# Patient Record
Sex: Male | Born: 1945 | Race: White | Hispanic: No | Marital: Married | Smoking: Former smoker
Health system: Southern US, Community
[De-identification: ages and names within clinical notes are randomized; demographics above are authoritative.]

## PROBLEM LIST (undated history)

## (undated) DIAGNOSIS — I471 Supraventricular tachycardia, unspecified: Secondary | ICD-10-CM

## (undated) DIAGNOSIS — C61 Malignant neoplasm of prostate: Secondary | ICD-10-CM

## (undated) DIAGNOSIS — I493 Ventricular premature depolarization: Secondary | ICD-10-CM

## (undated) DIAGNOSIS — R001 Bradycardia, unspecified: Secondary | ICD-10-CM

## (undated) DIAGNOSIS — E785 Hyperlipidemia, unspecified: Secondary | ICD-10-CM

## (undated) DIAGNOSIS — E7800 Pure hypercholesterolemia, unspecified: Secondary | ICD-10-CM

## (undated) DIAGNOSIS — R002 Palpitations: Secondary | ICD-10-CM

## (undated) DIAGNOSIS — M8580 Other specified disorders of bone density and structure, unspecified site: Secondary | ICD-10-CM

## (undated) DIAGNOSIS — E119 Type 2 diabetes mellitus without complications: Secondary | ICD-10-CM

## (undated) DIAGNOSIS — I340 Nonrheumatic mitral (valve) insufficiency: Secondary | ICD-10-CM

## (undated) HISTORY — DX: Malignant neoplasm of prostate: C61

## (undated) HISTORY — DX: Pure hypercholesterolemia, unspecified: E78.00

## (undated) HISTORY — PX: STAPLE HEMORRHOIDECTOMY: SHX2438

## (undated) HISTORY — PX: COLON SURGERY: SHX602

## (undated) HISTORY — PX: COLONOSCOPY W/ POLYPECTOMY: SHX1380

---

## 2004-02-23 HISTORY — PX: LAPAROSCOPIC RETROPUBIC PROSTATECTOMY: SUR794

## 2004-12-20 ENCOUNTER — Ambulatory Visit: Payer: Self-pay | Admitting: Radiation Oncology

## 2005-01-01 ENCOUNTER — Ambulatory Visit: Payer: Self-pay | Admitting: Radiation Oncology

## 2005-01-22 ENCOUNTER — Ambulatory Visit: Payer: Self-pay | Admitting: Radiation Oncology

## 2005-02-22 ENCOUNTER — Ambulatory Visit: Payer: Self-pay | Admitting: Radiation Oncology

## 2005-03-24 ENCOUNTER — Ambulatory Visit: Payer: Self-pay | Admitting: Radiation Oncology

## 2005-04-24 ENCOUNTER — Ambulatory Visit: Payer: Self-pay | Admitting: Radiation Oncology

## 2005-07-15 ENCOUNTER — Ambulatory Visit: Payer: Self-pay | Admitting: Radiation Oncology

## 2005-07-25 ENCOUNTER — Ambulatory Visit: Payer: Self-pay | Admitting: Radiation Oncology

## 2005-08-21 ENCOUNTER — Ambulatory Visit: Payer: Self-pay | Admitting: Unknown Physician Specialty

## 2006-01-20 ENCOUNTER — Ambulatory Visit: Payer: Self-pay | Admitting: Radiation Oncology

## 2006-02-03 ENCOUNTER — Ambulatory Visit: Payer: Self-pay | Admitting: Family Medicine

## 2006-07-31 ENCOUNTER — Ambulatory Visit: Payer: Self-pay | Admitting: Radiation Oncology

## 2006-08-23 ENCOUNTER — Ambulatory Visit: Payer: Self-pay | Admitting: Radiation Oncology

## 2007-01-23 ENCOUNTER — Ambulatory Visit: Payer: Self-pay | Admitting: Radiation Oncology

## 2007-02-05 ENCOUNTER — Ambulatory Visit: Payer: Self-pay | Admitting: Radiation Oncology

## 2007-02-23 ENCOUNTER — Ambulatory Visit: Payer: Self-pay | Admitting: Radiation Oncology

## 2007-03-25 ENCOUNTER — Ambulatory Visit: Payer: Self-pay | Admitting: Radiation Oncology

## 2007-05-04 ENCOUNTER — Ambulatory Visit: Payer: Self-pay | Admitting: Oncology

## 2007-05-25 ENCOUNTER — Ambulatory Visit: Payer: Self-pay | Admitting: Oncology

## 2007-06-25 ENCOUNTER — Ambulatory Visit: Payer: Self-pay | Admitting: Oncology

## 2007-07-26 ENCOUNTER — Ambulatory Visit: Payer: Self-pay | Admitting: Oncology

## 2007-08-23 ENCOUNTER — Ambulatory Visit: Payer: Self-pay | Admitting: Oncology

## 2007-09-23 ENCOUNTER — Ambulatory Visit: Payer: Self-pay | Admitting: Oncology

## 2007-11-23 ENCOUNTER — Ambulatory Visit: Payer: Self-pay | Admitting: Oncology

## 2007-12-23 ENCOUNTER — Ambulatory Visit: Payer: Self-pay | Admitting: Oncology

## 2008-01-07 ENCOUNTER — Ambulatory Visit: Payer: Self-pay | Admitting: Oncology

## 2008-01-23 ENCOUNTER — Ambulatory Visit: Payer: Self-pay | Admitting: Oncology

## 2008-02-23 ENCOUNTER — Ambulatory Visit: Payer: Self-pay | Admitting: Oncology

## 2008-06-24 ENCOUNTER — Ambulatory Visit: Payer: Self-pay | Admitting: Oncology

## 2008-07-04 ENCOUNTER — Ambulatory Visit: Payer: Self-pay | Admitting: Oncology

## 2008-07-25 ENCOUNTER — Ambulatory Visit: Payer: Self-pay | Admitting: Oncology

## 2008-09-14 ENCOUNTER — Ambulatory Visit: Payer: Self-pay | Admitting: Unknown Physician Specialty

## 2008-09-22 ENCOUNTER — Ambulatory Visit: Payer: Self-pay | Admitting: Oncology

## 2008-10-10 ENCOUNTER — Ambulatory Visit: Payer: Self-pay | Admitting: Oncology

## 2008-10-22 ENCOUNTER — Ambulatory Visit: Payer: Self-pay | Admitting: Oncology

## 2008-11-22 ENCOUNTER — Ambulatory Visit: Payer: Self-pay | Admitting: Oncology

## 2008-11-23 ENCOUNTER — Ambulatory Visit: Payer: Self-pay | Admitting: Oncology

## 2008-12-22 ENCOUNTER — Ambulatory Visit: Payer: Self-pay | Admitting: Oncology

## 2009-01-04 ENCOUNTER — Ambulatory Visit: Payer: Self-pay | Admitting: Oncology

## 2009-01-22 ENCOUNTER — Ambulatory Visit: Payer: Self-pay | Admitting: Oncology

## 2009-03-24 ENCOUNTER — Ambulatory Visit: Payer: Self-pay | Admitting: Oncology

## 2009-04-06 ENCOUNTER — Ambulatory Visit: Payer: Self-pay | Admitting: Oncology

## 2009-04-24 ENCOUNTER — Ambulatory Visit: Payer: Self-pay | Admitting: Oncology

## 2009-05-24 ENCOUNTER — Ambulatory Visit: Payer: Self-pay | Admitting: Oncology

## 2009-06-24 ENCOUNTER — Ambulatory Visit: Payer: Self-pay | Admitting: Oncology

## 2009-07-05 ENCOUNTER — Ambulatory Visit: Payer: Self-pay | Admitting: Oncology

## 2009-07-25 ENCOUNTER — Ambulatory Visit: Payer: Self-pay | Admitting: Oncology

## 2009-08-22 ENCOUNTER — Ambulatory Visit: Payer: Self-pay | Admitting: Oncology

## 2009-09-05 ENCOUNTER — Ambulatory Visit: Payer: Self-pay | Admitting: Oncology

## 2009-09-22 ENCOUNTER — Ambulatory Visit: Payer: Self-pay | Admitting: Oncology

## 2009-10-22 ENCOUNTER — Ambulatory Visit: Payer: Self-pay | Admitting: Oncology

## 2009-11-22 ENCOUNTER — Ambulatory Visit: Payer: Self-pay | Admitting: Oncology

## 2009-12-13 ENCOUNTER — Ambulatory Visit: Payer: Self-pay | Admitting: Oncology

## 2009-12-22 ENCOUNTER — Ambulatory Visit: Payer: Self-pay | Admitting: Oncology

## 2010-02-22 ENCOUNTER — Ambulatory Visit: Payer: Self-pay | Admitting: Oncology

## 2010-03-22 ENCOUNTER — Ambulatory Visit: Payer: Self-pay | Admitting: Oncology

## 2010-03-23 LAB — PSA

## 2010-03-24 ENCOUNTER — Ambulatory Visit: Payer: Self-pay | Admitting: Oncology

## 2010-07-06 ENCOUNTER — Ambulatory Visit: Payer: Self-pay | Admitting: Oncology

## 2010-07-07 LAB — PSA

## 2010-07-25 ENCOUNTER — Ambulatory Visit: Payer: Self-pay | Admitting: Oncology

## 2010-08-23 ENCOUNTER — Ambulatory Visit: Payer: Self-pay | Admitting: Oncology

## 2010-11-12 ENCOUNTER — Ambulatory Visit: Payer: Self-pay | Admitting: Oncology

## 2010-11-13 LAB — PSA

## 2010-11-23 ENCOUNTER — Ambulatory Visit: Payer: Self-pay | Admitting: Oncology

## 2011-02-12 ENCOUNTER — Ambulatory Visit: Payer: Self-pay | Admitting: Oncology

## 2011-02-13 LAB — PSA: PSA: 6.7 ng/mL — ABNORMAL HIGH (ref 0.0–4.0)

## 2011-02-23 ENCOUNTER — Ambulatory Visit: Payer: Self-pay | Admitting: Oncology

## 2011-03-25 ENCOUNTER — Ambulatory Visit: Payer: Self-pay | Admitting: Oncology

## 2011-03-25 ENCOUNTER — Ambulatory Visit: Payer: Self-pay

## 2011-05-22 ENCOUNTER — Ambulatory Visit: Payer: Self-pay | Admitting: Oncology

## 2011-05-23 LAB — PSA: PSA: 11.3 ng/mL — ABNORMAL HIGH (ref 0.0–4.0)

## 2011-05-25 ENCOUNTER — Ambulatory Visit: Payer: Self-pay | Admitting: Oncology

## 2011-06-25 ENCOUNTER — Ambulatory Visit: Payer: Self-pay | Admitting: Internal Medicine

## 2011-06-25 ENCOUNTER — Ambulatory Visit: Payer: Self-pay | Admitting: Oncology

## 2011-08-07 ENCOUNTER — Ambulatory Visit: Payer: Self-pay | Admitting: Oncology

## 2011-08-07 LAB — COMPREHENSIVE METABOLIC PANEL
Albumin: 3.9 g/dL (ref 3.4–5.0)
Alkaline Phosphatase: 84 U/L (ref 50–136)
Anion Gap: 4 — ABNORMAL LOW (ref 7–16)
BUN: 14 mg/dL (ref 7–18)
Bilirubin,Total: 0.5 mg/dL (ref 0.2–1.0)
Calcium, Total: 8.9 mg/dL (ref 8.5–10.1)
Chloride: 104 mmol/L (ref 98–107)
Co2: 31 mmol/L (ref 21–32)
Creatinine: 0.83 mg/dL (ref 0.60–1.30)
EGFR (African American): >60
EGFR (Non-African Amer.): >60
Glucose: 106 mg/dL — ABNORMAL HIGH (ref 65–99)
Osmolality: 278 (ref 275–301)
Potassium: 4.7 mmol/L (ref 3.5–5.1)
SGOT(AST): 16 U/L (ref 15–37)
SGPT (ALT): 36 U/L
Sodium: 139 mmol/L (ref 136–145)
Total Protein: 7 g/dL (ref 6.4–8.2)

## 2011-08-07 LAB — CBC CANCER CENTER
Basophil #: 0 x10 3/mm (ref 0.0–0.1)
Basophil %: 0.7 %
Eosinophil #: 0.2 x10 3/mm (ref 0.0–0.7)
Eosinophil %: 5.2 %
HCT: 41.4 % (ref 40.0–52.0)
HGB: 14.3 g/dL (ref 13.0–18.0)
Lymphocyte #: 1 x10 3/mm (ref 1.0–3.6)
Lymphocyte %: 22.2 %
MCH: 31.1 pg (ref 26.0–34.0)
MCHC: 34.5 g/dL (ref 32.0–36.0)
MCV: 90 fL (ref 80–100)
Monocyte #: 0.4 x10 3/mm (ref 0.0–0.7)
Monocyte %: 8.4 %
Neutrophil #: 2.8 x10 3/mm (ref 1.4–6.5)
Neutrophil %: 63.5 %
Platelet: 239 x10 3/mm (ref 150–440)
RBC: 4.59 10*6/uL (ref 4.40–5.90)
RDW: 14.2 % (ref 11.5–14.5)
WBC: 4.3 x10 3/mm (ref 3.8–10.6)

## 2011-08-08 LAB — PSA: PSA: 19.3 ng/mL — ABNORMAL HIGH (ref 0.0–4.0)

## 2011-08-23 ENCOUNTER — Ambulatory Visit: Payer: Self-pay | Admitting: Oncology

## 2011-10-01 ENCOUNTER — Ambulatory Visit: Payer: Self-pay | Admitting: Oncology

## 2011-10-01 LAB — COMPREHENSIVE METABOLIC PANEL
Albumin: 4 g/dL (ref 3.4–5.0)
Alkaline Phosphatase: 67 U/L (ref 50–136)
Anion Gap: 8 (ref 7–16)
BUN: 27 mg/dL — ABNORMAL HIGH (ref 7–18)
Bilirubin,Total: 1.2 mg/dL — ABNORMAL HIGH (ref 0.2–1.0)
Calcium, Total: 8.9 mg/dL (ref 8.5–10.1)
Chloride: 102 mmol/L (ref 98–107)
Co2: 28 mmol/L (ref 21–32)
Creatinine: 0.89 mg/dL (ref 0.60–1.30)
EGFR (African American): >60
EGFR (Non-African Amer.): >60
Glucose: 110 mg/dL — ABNORMAL HIGH (ref 65–99)
Osmolality: 281 (ref 275–301)
Potassium: 3.8 mmol/L (ref 3.5–5.1)
SGOT(AST): 13 U/L — ABNORMAL LOW (ref 15–37)
SGPT (ALT): 24 U/L
Sodium: 138 mmol/L (ref 136–145)
Total Protein: 7 g/dL (ref 6.4–8.2)

## 2011-10-01 LAB — CBC CANCER CENTER
Basophil #: 0 x10 3/mm (ref 0.0–0.1)
Basophil %: 0.5 %
Eosinophil #: 0.1 x10 3/mm (ref 0.0–0.7)
Eosinophil %: 2.2 %
HCT: 43 % (ref 40.0–52.0)
HGB: 14.8 g/dL (ref 13.0–18.0)
Lymphocyte #: 1.3 x10 3/mm (ref 1.0–3.6)
Lymphocyte %: 26.3 %
MCH: 31.6 pg (ref 26.0–34.0)
MCHC: 34.4 g/dL (ref 32.0–36.0)
MCV: 92 fL (ref 80–100)
Monocyte #: 0.4 x10 3/mm (ref 0.0–0.7)
Monocyte %: 7.7 %
Neutrophil #: 3.2 x10 3/mm (ref 1.4–6.5)
Neutrophil %: 63.3 %
Platelet: 249 x10 3/mm (ref 150–440)
RBC: 4.68 10*6/uL (ref 4.40–5.90)
RDW: 13.3 % (ref 11.5–14.5)
WBC: 5.1 x10 3/mm (ref 3.8–10.6)

## 2011-10-02 LAB — PSA: PSA: 1.3 ng/mL (ref 0.0–4.0)

## 2011-10-23 ENCOUNTER — Ambulatory Visit: Payer: Self-pay | Admitting: Oncology

## 2011-10-29 LAB — CBC CANCER CENTER
Basophil #: 0 x10 3/mm (ref 0.0–0.1)
Basophil %: 0.5 %
Eosinophil #: 0.1 x10 3/mm (ref 0.0–0.7)
Eosinophil %: 2.1 %
HCT: 44.4 % (ref 40.0–52.0)
HGB: 14.6 g/dL (ref 13.0–18.0)
Lymphocyte #: 1.1 x10 3/mm (ref 1.0–3.6)
Lymphocyte %: 19.1 %
MCH: 30.5 pg (ref 26.0–34.0)
MCHC: 32.8 g/dL (ref 32.0–36.0)
MCV: 93 fL (ref 80–100)
Monocyte #: 0.4 x10 3/mm (ref 0.2–1.0)
Monocyte %: 6.4 %
Neutrophil #: 4.3 x10 3/mm (ref 1.4–6.5)
Neutrophil %: 71.9 %
Platelet: 225 x10 3/mm (ref 150–440)
RBC: 4.77 10*6/uL (ref 4.40–5.90)
RDW: 14.3 % (ref 11.5–14.5)
WBC: 5.9 x10 3/mm (ref 3.8–10.6)

## 2011-10-29 LAB — COMPREHENSIVE METABOLIC PANEL
Albumin: 3.8 g/dL (ref 3.4–5.0)
Alkaline Phosphatase: 66 U/L (ref 50–136)
Anion Gap: 7 (ref 7–16)
BUN: 21 mg/dL — ABNORMAL HIGH (ref 7–18)
Bilirubin,Total: 0.8 mg/dL (ref 0.2–1.0)
Calcium, Total: 8.8 mg/dL (ref 8.5–10.1)
Chloride: 103 mmol/L (ref 98–107)
Co2: 32 mmol/L (ref 21–32)
Creatinine: 0.82 mg/dL (ref 0.60–1.30)
EGFR (African American): >60
EGFR (Non-African Amer.): >60
Glucose: 111 mg/dL — ABNORMAL HIGH (ref 65–99)
Osmolality: 287 (ref 275–301)
Potassium: 4.3 mmol/L (ref 3.5–5.1)
SGOT(AST): 15 U/L (ref 15–37)
SGPT (ALT): 29 U/L
Sodium: 142 mmol/L (ref 136–145)
Total Protein: 6.9 g/dL (ref 6.4–8.2)

## 2011-10-30 LAB — PSA: PSA: 0.3 ng/mL (ref 0.0–4.0)

## 2011-11-23 ENCOUNTER — Ambulatory Visit: Payer: Self-pay | Admitting: Oncology

## 2011-12-10 LAB — CBC CANCER CENTER
Basophil #: 0 x10 3/mm (ref 0.0–0.1)
Basophil %: 0.5 %
Eosinophil #: 0.3 x10 3/mm (ref 0.0–0.7)
Eosinophil %: 3.7 %
HCT: 43.6 % (ref 40.0–52.0)
HGB: 14.4 g/dL (ref 13.0–18.0)
Lymphocyte #: 0.9 x10 3/mm — ABNORMAL LOW (ref 1.0–3.6)
Lymphocyte %: 13.2 %
MCH: 30.4 pg (ref 26.0–34.0)
MCHC: 33 g/dL (ref 32.0–36.0)
MCV: 92 fL (ref 80–100)
Monocyte #: 0.5 x10 3/mm (ref 0.2–1.0)
Monocyte %: 7 %
Neutrophil #: 5.2 x10 3/mm (ref 1.4–6.5)
Neutrophil %: 75.6 %
Platelet: 227 x10 3/mm (ref 150–440)
RBC: 4.74 10*6/uL (ref 4.40–5.90)
RDW: 13.8 % (ref 11.5–14.5)
WBC: 6.9 x10 3/mm (ref 3.8–10.6)

## 2011-12-10 LAB — COMPREHENSIVE METABOLIC PANEL
Albumin: 3.7 g/dL (ref 3.4–5.0)
Alkaline Phosphatase: 80 U/L (ref 50–136)
Anion Gap: 7 (ref 7–16)
BUN: 18 mg/dL (ref 7–18)
Bilirubin,Total: 1.3 mg/dL — ABNORMAL HIGH (ref 0.2–1.0)
Calcium, Total: 8.8 mg/dL (ref 8.5–10.1)
Chloride: 104 mmol/L (ref 98–107)
Co2: 31 mmol/L (ref 21–32)
Creatinine: 0.98 mg/dL (ref 0.60–1.30)
EGFR (African American): >60
EGFR (Non-African Amer.): >60
Glucose: 137 mg/dL — ABNORMAL HIGH (ref 65–99)
Osmolality: 287 (ref 275–301)
Potassium: 3.7 mmol/L (ref 3.5–5.1)
SGOT(AST): 16 U/L (ref 15–37)
SGPT (ALT): 37 U/L
Sodium: 142 mmol/L (ref 136–145)
Total Protein: 6.8 g/dL (ref 6.4–8.2)

## 2011-12-11 LAB — PSA: PSA: <0.1 ng/mL (ref 0.0–4.0)

## 2011-12-23 ENCOUNTER — Ambulatory Visit: Payer: Self-pay | Admitting: Oncology

## 2012-01-22 IMAGING — NM NUCLEAR MEDICINE WHOLE BODY BONE SCINTIGRAPHY
4 series · 20 of 20 positions shown · non-contrast
Comparison: none

REASON FOR EXAM: prostate CA rising PSA
COMMENTS:

[Series 1000: statics (reformatted series) · 2.40mm/px · 4 acquisitions, 8 frames shown]
[im 1/4]
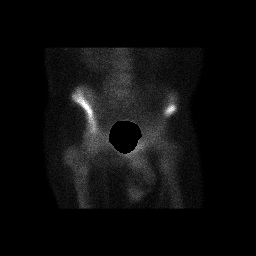
[im 1/4]
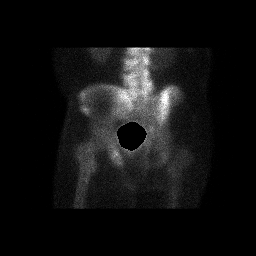
[im 2/4]
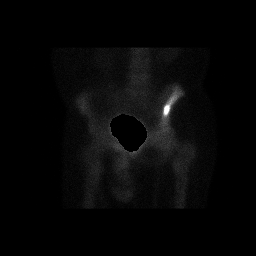
[im 2/4]
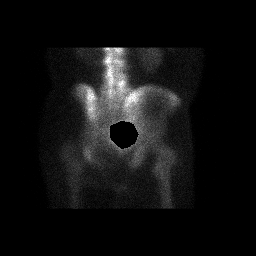
[im 3/4]
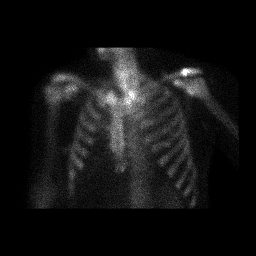
[im 3/4]
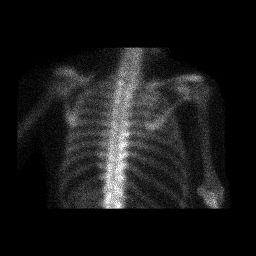
[im 4/4]
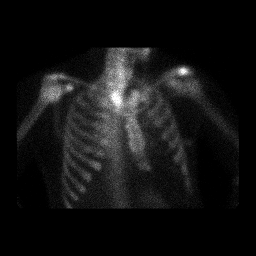
[im 4/4]
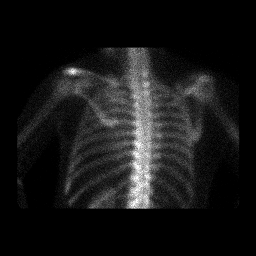

[Series 1000: 3 hr wholebody (reformatted series) · 2.40mm/px · 2 of 2 frames shown]
[frame 1/2]
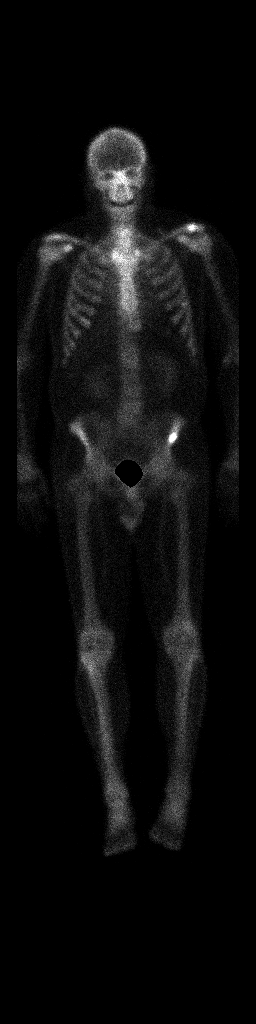
[frame 2/2]
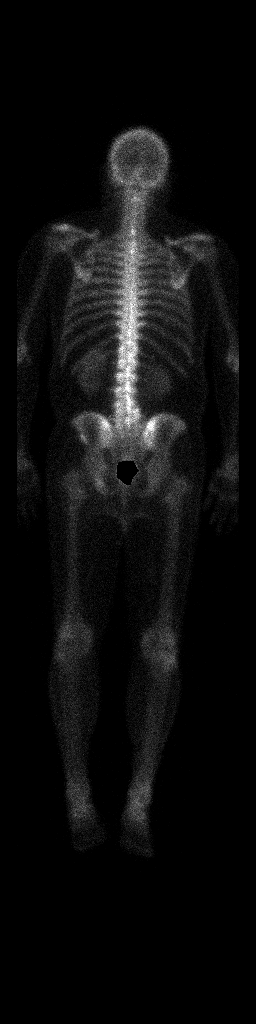

[Series 1000: statics · 2.40mm/px · 4 acquisitions, 8 frames shown]
[im 1/4]
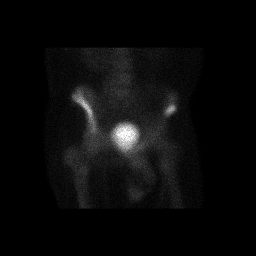
[im 1/4]
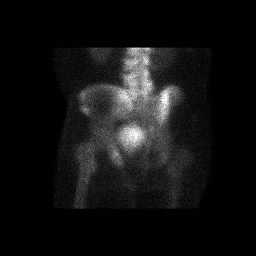
[im 2/4]
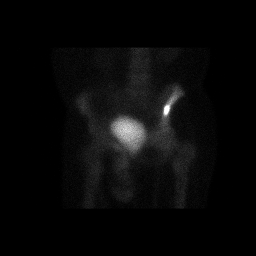
[im 2/4]
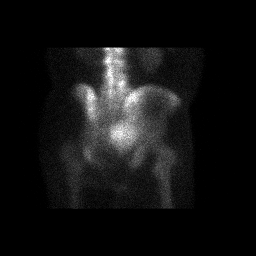
[im 3/4]
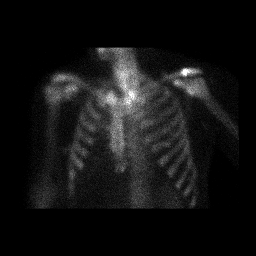
[im 3/4]
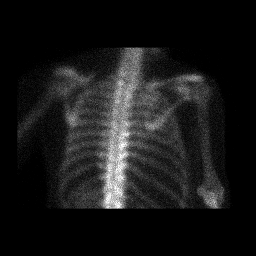
[im 4/4]
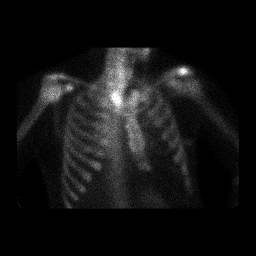
[im 4/4]
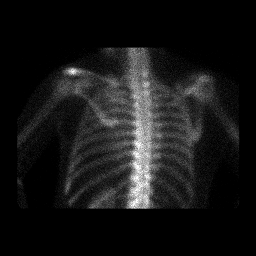

[Series 1000: 3 hr wholebody · 2.40mm/px · 2 of 2 frames shown]
[frame 1/2]
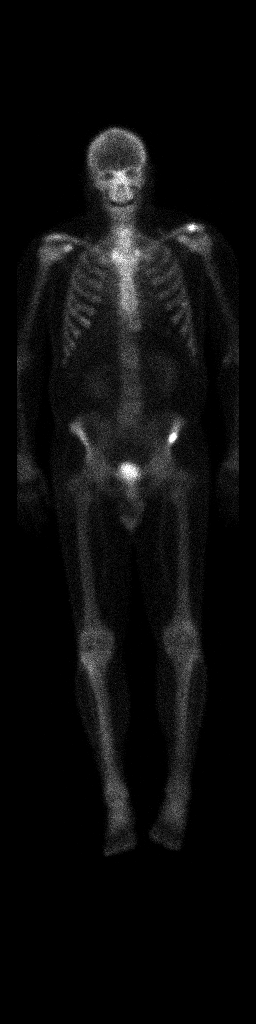
[frame 2/2]
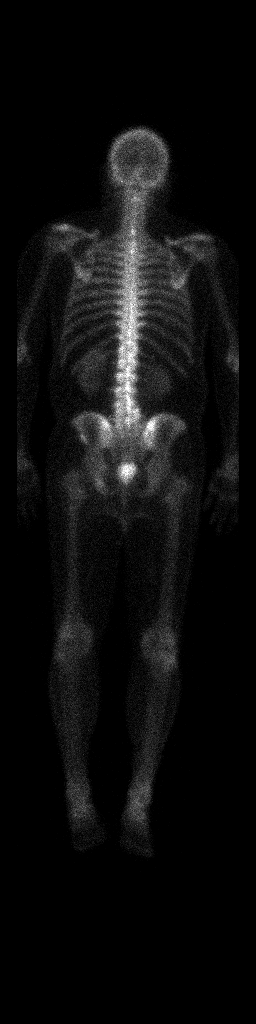

[20 of 20 positions shown; findings below may reference images not displayed]

PROCEDURE:     KNM - KNM BONE WB 3HR [DATE] [DATE]

RESULT:     Following intravenous administration of 22.59 mCi technetium 99m
MDP, total body bone scan was performed. The current exam is compared to the
prior examination of 02/10/2007. There is a nonspecific focal area of
increased tracer activity involving the lateral aspect of the left ilium.
The finding is slightly more prominent than on the prior exam of 8330.
Correlation with plain film radiographs and possibly MR are recommended if
clinically indicated. The distribution of tracer activity throughout the
skeletal system otherwise is normal and unchanged since the prior exam.
Tracer activity is again seen in both kidneys.
IMPRESSION: Normal study except for nonspecific focal area of increased tracer activity
in the right ilium that is slightly more prominent than was evident on the
exam of 02/10/2007. Correlation with plain film radiographs and possibly MR
are recommended if clinically indicated.

## 2012-02-04 ENCOUNTER — Ambulatory Visit: Payer: Self-pay | Admitting: Oncology

## 2012-02-04 LAB — COMPREHENSIVE METABOLIC PANEL
Albumin: 3.8 g/dL (ref 3.4–5.0)
Alkaline Phosphatase: 80 U/L (ref 50–136)
Anion Gap: 8 (ref 7–16)
BUN: 15 mg/dL (ref 7–18)
Bilirubin,Total: 1.1 mg/dL — ABNORMAL HIGH (ref 0.2–1.0)
Calcium, Total: 9.2 mg/dL (ref 8.5–10.1)
Chloride: 103 mmol/L (ref 98–107)
Co2: 30 mmol/L (ref 21–32)
Creatinine: 0.93 mg/dL (ref 0.60–1.30)
EGFR (African American): >60
EGFR (Non-African Amer.): >60
Glucose: 115 mg/dL — ABNORMAL HIGH (ref 65–99)
Osmolality: 283 (ref 275–301)
Potassium: 3.8 mmol/L (ref 3.5–5.1)
SGOT(AST): 14 U/L — ABNORMAL LOW (ref 15–37)
SGPT (ALT): 36 U/L (ref 12–78)
Sodium: 141 mmol/L (ref 136–145)
Total Protein: 6.9 g/dL (ref 6.4–8.2)

## 2012-02-04 LAB — CBC CANCER CENTER
Basophil #: 0 x10 3/mm (ref 0.0–0.1)
Basophil %: 0.7 %
Eosinophil #: 0.1 x10 3/mm (ref 0.0–0.7)
Eosinophil %: 2.2 %
HCT: 44.1 % (ref 40.0–52.0)
HGB: 14.4 g/dL (ref 13.0–18.0)
Lymphocyte #: 1.2 x10 3/mm (ref 1.0–3.6)
Lymphocyte %: 18.5 %
MCH: 30.3 pg (ref 26.0–34.0)
MCHC: 32.6 g/dL (ref 32.0–36.0)
MCV: 93 fL (ref 80–100)
Monocyte #: 0.5 x10 3/mm (ref 0.2–1.0)
Monocyte %: 7.9 %
Neutrophil #: 4.5 x10 3/mm (ref 1.4–6.5)
Neutrophil %: 70.7 %
Platelet: 251 x10 3/mm (ref 150–440)
RBC: 4.75 10*6/uL (ref 4.40–5.90)
RDW: 13.8 % (ref 11.5–14.5)
WBC: 6.4 x10 3/mm (ref 3.8–10.6)

## 2012-02-05 LAB — PSA: PSA: <0.1 ng/mL (ref 0.0–4.0)

## 2012-02-23 ENCOUNTER — Ambulatory Visit: Payer: Self-pay | Admitting: Oncology

## 2012-04-14 ENCOUNTER — Ambulatory Visit: Payer: Self-pay | Admitting: Oncology

## 2012-04-14 LAB — CBC CANCER CENTER
Basophil #: 0 x10 3/mm (ref 0.0–0.1)
Basophil %: 0.6 %
Eosinophil #: 0.1 x10 3/mm (ref 0.0–0.7)
Eosinophil %: 1.2 %
HCT: 43.6 % (ref 40.0–52.0)
HGB: 14.3 g/dL (ref 13.0–18.0)
Lymphocyte #: 0.8 x10 3/mm — ABNORMAL LOW (ref 1.0–3.6)
Lymphocyte %: 10.4 %
MCH: 30.4 pg (ref 26.0–34.0)
MCHC: 32.8 g/dL (ref 32.0–36.0)
MCV: 93 fL (ref 80–100)
Monocyte #: 0.4 x10 3/mm (ref 0.2–1.0)
Monocyte %: 5 %
Neutrophil #: 6.7 x10 3/mm — ABNORMAL HIGH (ref 1.4–6.5)
Neutrophil %: 82.8 %
Platelet: 239 x10 3/mm (ref 150–440)
RBC: 4.71 10*6/uL (ref 4.40–5.90)
RDW: 13.5 % (ref 11.5–14.5)
WBC: 8.1 x10 3/mm (ref 3.8–10.6)

## 2012-04-14 LAB — COMPREHENSIVE METABOLIC PANEL
Albumin: 3.8 g/dL (ref 3.4–5.0)
Alkaline Phosphatase: 76 U/L (ref 50–136)
Anion Gap: 10 (ref 7–16)
BUN: 16 mg/dL (ref 7–18)
Bilirubin,Total: 0.8 mg/dL (ref 0.2–1.0)
Calcium, Total: 9.2 mg/dL (ref 8.5–10.1)
Chloride: 104 mmol/L (ref 98–107)
Co2: 26 mmol/L (ref 21–32)
Creatinine: 1.08 mg/dL (ref 0.60–1.30)
EGFR (African American): >60
EGFR (Non-African Amer.): >60
Glucose: 125 mg/dL — ABNORMAL HIGH (ref 65–99)
Osmolality: 282 (ref 275–301)
Potassium: 4.5 mmol/L (ref 3.5–5.1)
SGOT(AST): 12 U/L — ABNORMAL LOW (ref 15–37)
SGPT (ALT): 29 U/L (ref 12–78)
Sodium: 140 mmol/L (ref 136–145)
Total Protein: 6.9 g/dL (ref 6.4–8.2)

## 2012-04-15 LAB — PSA: PSA: <0.1 ng/mL (ref 0.0–4.0)

## 2012-04-24 ENCOUNTER — Ambulatory Visit: Payer: Self-pay | Admitting: Oncology

## 2012-06-24 ENCOUNTER — Ambulatory Visit: Payer: Self-pay | Admitting: Oncology

## 2012-07-14 LAB — COMPREHENSIVE METABOLIC PANEL
Albumin: 3.6 g/dL (ref 3.4–5.0)
Alkaline Phosphatase: 81 U/L (ref 50–136)
Anion Gap: 8 (ref 7–16)
BUN: 16 mg/dL (ref 7–18)
Bilirubin,Total: 0.9 mg/dL (ref 0.2–1.0)
Calcium, Total: 8.7 mg/dL (ref 8.5–10.1)
Chloride: 103 mmol/L (ref 98–107)
Co2: 31 mmol/L (ref 21–32)
Creatinine: 1.26 mg/dL (ref 0.60–1.30)
EGFR (African American): >60
EGFR (Non-African Amer.): 59 — ABNORMAL LOW
Glucose: 141 mg/dL — ABNORMAL HIGH (ref 65–99)
Osmolality: 287 (ref 275–301)
Potassium: 3.8 mmol/L (ref 3.5–5.1)
SGOT(AST): 12 U/L — ABNORMAL LOW (ref 15–37)
SGPT (ALT): 23 U/L (ref 12–78)
Sodium: 142 mmol/L (ref 136–145)
Total Protein: 6.6 g/dL (ref 6.4–8.2)

## 2012-07-14 LAB — CBC CANCER CENTER
Basophil #: 0.1 x10 3/mm (ref 0.0–0.1)
Basophil %: 1 %
Eosinophil #: 0.2 x10 3/mm (ref 0.0–0.7)
Eosinophil %: 2.6 %
HCT: 42.9 % (ref 40.0–52.0)
HGB: 14.7 g/dL (ref 13.0–18.0)
Lymphocyte #: 1.3 x10 3/mm (ref 1.0–3.6)
Lymphocyte %: 20.8 %
MCH: 31.1 pg (ref 26.0–34.0)
MCHC: 34.3 g/dL (ref 32.0–36.0)
MCV: 91 fL (ref 80–100)
Monocyte #: 0.4 x10 3/mm (ref 0.2–1.0)
Monocyte %: 5.7 %
Neutrophil #: 4.4 x10 3/mm (ref 1.4–6.5)
Neutrophil %: 69.9 %
Platelet: 257 x10 3/mm (ref 150–440)
RBC: 4.74 10*6/uL (ref 4.40–5.90)
RDW: 13.5 % (ref 11.5–14.5)
WBC: 6.3 x10 3/mm (ref 3.8–10.6)

## 2012-07-15 LAB — PSA: PSA: <0.1 ng/mL (ref 0.0–4.0)

## 2012-07-25 ENCOUNTER — Ambulatory Visit: Payer: Self-pay | Admitting: Oncology

## 2012-10-13 ENCOUNTER — Ambulatory Visit: Payer: Self-pay | Admitting: Oncology

## 2012-10-13 LAB — CBC CANCER CENTER
Basophil #: 0.1 x10 3/mm (ref 0.0–0.1)
Basophil %: 1.1 %
Eosinophil #: 0.2 x10 3/mm (ref 0.0–0.7)
Eosinophil %: 4.1 %
HCT: 41.6 % (ref 40.0–52.0)
HGB: 14.2 g/dL (ref 13.0–18.0)
Lymphocyte #: 0.9 x10 3/mm — ABNORMAL LOW (ref 1.0–3.6)
Lymphocyte %: 18.2 %
MCH: 30.5 pg (ref 26.0–34.0)
MCHC: 34.1 g/dL (ref 32.0–36.0)
MCV: 90 fL (ref 80–100)
Monocyte #: 0.4 x10 3/mm (ref 0.2–1.0)
Monocyte %: 8.8 %
Neutrophil #: 3.5 x10 3/mm (ref 1.4–6.5)
Neutrophil %: 67.8 %
Platelet: 254 x10 3/mm (ref 150–440)
RBC: 4.64 10*6/uL (ref 4.40–5.90)
RDW: 13.4 % (ref 11.5–14.5)
WBC: 5.1 x10 3/mm (ref 3.8–10.6)

## 2012-10-13 LAB — COMPREHENSIVE METABOLIC PANEL
Albumin: 3.8 g/dL (ref 3.4–5.0)
Alkaline Phosphatase: 90 U/L (ref 50–136)
Anion Gap: 9 (ref 7–16)
BUN: 12 mg/dL (ref 7–18)
Bilirubin,Total: 1 mg/dL (ref 0.2–1.0)
Calcium, Total: 9 mg/dL (ref 8.5–10.1)
Chloride: 103 mmol/L (ref 98–107)
Co2: 30 mmol/L (ref 21–32)
Creatinine: 1.04 mg/dL (ref 0.60–1.30)
EGFR (African American): >60
EGFR (Non-African Amer.): >60
Glucose: 120 mg/dL — ABNORMAL HIGH (ref 65–99)
Osmolality: 284 (ref 275–301)
Potassium: 4.2 mmol/L (ref 3.5–5.1)
SGOT(AST): 15 U/L (ref 15–37)
SGPT (ALT): 25 U/L (ref 12–78)
Sodium: 142 mmol/L (ref 136–145)
Total Protein: 6.7 g/dL (ref 6.4–8.2)

## 2012-10-14 LAB — PSA: PSA: <0.1 ng/mL (ref 0.0–4.0)

## 2012-10-22 ENCOUNTER — Ambulatory Visit: Payer: Self-pay | Admitting: Oncology

## 2012-12-22 ENCOUNTER — Ambulatory Visit: Payer: Self-pay | Admitting: Oncology

## 2013-01-12 LAB — COMPREHENSIVE METABOLIC PANEL
Albumin: 3.8 g/dL (ref 3.4–5.0)
Alkaline Phosphatase: 88 U/L (ref 50–136)
Anion Gap: 3 — ABNORMAL LOW (ref 7–16)
BUN: 19 mg/dL — ABNORMAL HIGH (ref 7–18)
Bilirubin,Total: 1.1 mg/dL — ABNORMAL HIGH (ref 0.2–1.0)
Calcium, Total: 9.1 mg/dL (ref 8.5–10.1)
Chloride: 106 mmol/L (ref 98–107)
Co2: 30 mmol/L (ref 21–32)
Creatinine: 0.94 mg/dL (ref 0.60–1.30)
EGFR (African American): >60
EGFR (Non-African Amer.): >60
Glucose: 104 mg/dL — ABNORMAL HIGH (ref 65–99)
Osmolality: 280 (ref 275–301)
Potassium: 4.8 mmol/L (ref 3.5–5.1)
SGOT(AST): 13 U/L — ABNORMAL LOW (ref 15–37)
SGPT (ALT): 21 U/L (ref 12–78)
Sodium: 139 mmol/L (ref 136–145)
Total Protein: 6.7 g/dL (ref 6.4–8.2)

## 2013-01-12 LAB — CBC CANCER CENTER
Basophil #: 0 x10 3/mm (ref 0.0–0.1)
Basophil %: 0.9 %
Eosinophil #: 0.1 x10 3/mm (ref 0.0–0.7)
Eosinophil %: 2.9 %
HCT: 40.4 % (ref 40.0–52.0)
HGB: 14.2 g/dL (ref 13.0–18.0)
Lymphocyte #: 0.7 x10 3/mm — ABNORMAL LOW (ref 1.0–3.6)
Lymphocyte %: 14.3 %
MCH: 31.4 pg (ref 26.0–34.0)
MCHC: 35.2 g/dL (ref 32.0–36.0)
MCV: 89 fL (ref 80–100)
Monocyte #: 0.4 x10 3/mm (ref 0.2–1.0)
Monocyte %: 7.7 %
Neutrophil #: 3.7 x10 3/mm (ref 1.4–6.5)
Neutrophil %: 74.2 %
Platelet: 231 x10 3/mm (ref 150–440)
RBC: 4.53 10*6/uL (ref 4.40–5.90)
RDW: 13.6 % (ref 11.5–14.5)
WBC: 5 x10 3/mm (ref 3.8–10.6)

## 2013-01-13 LAB — PSA: PSA: <0.1 ng/mL (ref 0.0–4.0)

## 2013-01-22 ENCOUNTER — Ambulatory Visit: Payer: Self-pay | Admitting: Oncology

## 2013-04-07 ENCOUNTER — Ambulatory Visit: Payer: Self-pay | Admitting: Oncology

## 2013-04-07 LAB — COMPREHENSIVE METABOLIC PANEL WITH GFR
Albumin: 3.6 g/dL
Alkaline Phosphatase: 87 U/L
Anion Gap: 8
BUN: 19 mg/dL — ABNORMAL HIGH
Bilirubin,Total: 0.9 mg/dL
Calcium, Total: 8.3 mg/dL — ABNORMAL LOW
Chloride: 105 mmol/L
Co2: 29 mmol/L
Creatinine: 0.96 mg/dL
EGFR (African American): >60
EGFR (Non-African Amer.): >60
Glucose: 125 mg/dL — ABNORMAL HIGH
Osmolality: 287
Potassium: 4.1 mmol/L
SGOT(AST): 11 U/L — ABNORMAL LOW
SGPT (ALT): 23 U/L
Sodium: 142 mmol/L
Total Protein: 6.5 g/dL

## 2013-04-07 LAB — CBC CANCER CENTER
Basophil #: 0.1 10*3/uL
Basophil %: 1.3 %
Eosinophil #: 0.3 10*3/uL
Eosinophil %: 5.7 %
HCT: 41.8 %
HGB: 14.2 g/dL
Lymphocyte %: 22.1 %
Lymphs Abs: 1 10*3/uL
MCH: 30.8 pg
MCHC: 33.9 g/dL
MCV: 91 fL
Monocyte #: 0.3 10*3/uL
Monocyte %: 7.2 %
Neutrophil #: 3 10*3/uL
Neutrophil %: 63.7 %
Platelet: 229 10*3/uL
RBC: 4.6 10*6/uL
RDW: 13.6 %
WBC: 4.6 10*3/uL

## 2013-04-08 LAB — PSA: PSA: <0.1 ng/mL (ref 0.0–4.0)

## 2013-04-24 ENCOUNTER — Ambulatory Visit: Payer: Self-pay | Admitting: Oncology

## 2013-07-07 ENCOUNTER — Ambulatory Visit: Payer: Self-pay | Admitting: Oncology

## 2013-07-08 LAB — CBC CANCER CENTER
Basophil #: 0.1 x10 3/mm (ref 0.0–0.1)
Basophil %: 1.1 %
Eosinophil #: 0.3 x10 3/mm (ref 0.0–0.7)
Eosinophil %: 5.2 %
HCT: 42.2 % (ref 40.0–52.0)
HGB: 14.1 g/dL (ref 13.0–18.0)
Lymphocyte #: 1.1 x10 3/mm (ref 1.0–3.6)
Lymphocyte %: 21.7 %
MCH: 30.1 pg (ref 26.0–34.0)
MCHC: 33.4 g/dL (ref 32.0–36.0)
MCV: 90 fL (ref 80–100)
Monocyte #: 0.4 x10 3/mm (ref 0.2–1.0)
Monocyte %: 7.8 %
Neutrophil #: 3.1 x10 3/mm (ref 1.4–6.5)
Neutrophil %: 64.2 %
Platelet: 221 x10 3/mm (ref 150–440)
RBC: 4.69 10*6/uL (ref 4.40–5.90)
RDW: 13.2 % (ref 11.5–14.5)
WBC: 4.9 x10 3/mm (ref 3.8–10.6)

## 2013-07-08 LAB — COMPREHENSIVE METABOLIC PANEL
Albumin: 3.7 g/dL (ref 3.4–5.0)
Alkaline Phosphatase: 72 U/L
Anion Gap: 6 — ABNORMAL LOW (ref 7–16)
BUN: 16 mg/dL (ref 7–18)
Bilirubin,Total: 0.9 mg/dL (ref 0.2–1.0)
Calcium, Total: 8.4 mg/dL — ABNORMAL LOW (ref 8.5–10.1)
Chloride: 105 mmol/L (ref 98–107)
Co2: 30 mmol/L (ref 21–32)
Creatinine: 0.89 mg/dL (ref 0.60–1.30)
EGFR (African American): >60
EGFR (Non-African Amer.): >60
Glucose: 126 mg/dL — ABNORMAL HIGH (ref 65–99)
Osmolality: 284 (ref 275–301)
Potassium: 4.5 mmol/L (ref 3.5–5.1)
SGOT(AST): 11 U/L — ABNORMAL LOW (ref 15–37)
SGPT (ALT): 18 U/L (ref 12–78)
Sodium: 141 mmol/L (ref 136–145)
Total Protein: 6.5 g/dL (ref 6.4–8.2)

## 2013-07-09 LAB — PSA: PSA: <0.1 ng/mL (ref 0.0–4.0)

## 2013-07-25 ENCOUNTER — Ambulatory Visit: Payer: Self-pay | Admitting: Oncology

## 2013-10-04 ENCOUNTER — Ambulatory Visit: Payer: Self-pay | Admitting: Unknown Physician Specialty

## 2013-10-05 LAB — PATHOLOGY REPORT

## 2013-10-13 ENCOUNTER — Ambulatory Visit: Payer: Self-pay | Admitting: Oncology

## 2013-10-14 LAB — PSA: PSA: <0.1 ng/mL (ref 0.0–4.0)

## 2013-10-22 ENCOUNTER — Ambulatory Visit: Payer: Self-pay | Admitting: Oncology

## 2013-12-30 DIAGNOSIS — D229 Melanocytic nevi, unspecified: Secondary | ICD-10-CM

## 2013-12-30 HISTORY — DX: Melanocytic nevi, unspecified: D22.9

## 2014-01-05 ENCOUNTER — Ambulatory Visit: Payer: Self-pay | Admitting: Oncology

## 2014-01-05 LAB — CBC CANCER CENTER
Basophil #: 0.1 x10 3/mm (ref 0.0–0.1)
Basophil %: 1.1 %
Eosinophil #: 0.3 x10 3/mm (ref 0.0–0.7)
Eosinophil %: 3.8 %
HCT: 44.6 % (ref 40.0–52.0)
HGB: 14.9 g/dL (ref 13.0–18.0)
Lymphocyte #: 0.8 x10 3/mm — ABNORMAL LOW (ref 1.0–3.6)
Lymphocyte %: 11 %
MCH: 30.4 pg (ref 26.0–34.0)
MCHC: 33.3 g/dL (ref 32.0–36.0)
MCV: 91 fL (ref 80–100)
Monocyte #: 0.6 x10 3/mm (ref 0.2–1.0)
Monocyte %: 8 %
Neutrophil #: 5.6 x10 3/mm (ref 1.4–6.5)
Neutrophil %: 76.1 %
Platelet: 227 x10 3/mm (ref 150–440)
RBC: 4.89 10*6/uL (ref 4.40–5.90)
RDW: 14.2 % (ref 11.5–14.5)
WBC: 7.4 x10 3/mm (ref 3.8–10.6)

## 2014-01-05 LAB — COMPREHENSIVE METABOLIC PANEL
Albumin: 3.7 g/dL (ref 3.4–5.0)
Alkaline Phosphatase: 76 U/L
Anion Gap: 4 — ABNORMAL LOW (ref 7–16)
BUN: 15 mg/dL (ref 7–18)
Bilirubin,Total: 1.2 mg/dL — ABNORMAL HIGH (ref 0.2–1.0)
Calcium, Total: 9.2 mg/dL (ref 8.5–10.1)
Chloride: 105 mmol/L (ref 98–107)
Co2: 30 mmol/L (ref 21–32)
Creatinine: 0.97 mg/dL (ref 0.60–1.30)
EGFR (African American): >60
EGFR (Non-African Amer.): >60
Glucose: 109 mg/dL — ABNORMAL HIGH (ref 65–99)
Osmolality: 279 (ref 275–301)
Potassium: 4.3 mmol/L (ref 3.5–5.1)
SGOT(AST): 18 U/L (ref 15–37)
SGPT (ALT): 20 U/L (ref 12–78)
Sodium: 139 mmol/L (ref 136–145)
Total Protein: 6.9 g/dL (ref 6.4–8.2)

## 2014-01-06 LAB — PSA: PSA: <0.1 ng/mL (ref 0.0–4.0)

## 2014-01-22 ENCOUNTER — Ambulatory Visit: Payer: Self-pay | Admitting: Oncology

## 2014-01-31 DIAGNOSIS — E119 Type 2 diabetes mellitus without complications: Secondary | ICD-10-CM | POA: Insufficient documentation

## 2014-01-31 DIAGNOSIS — I10 Essential (primary) hypertension: Secondary | ICD-10-CM | POA: Insufficient documentation

## 2014-01-31 DIAGNOSIS — Z8546 Personal history of malignant neoplasm of prostate: Secondary | ICD-10-CM | POA: Insufficient documentation

## 2014-01-31 DIAGNOSIS — M8580 Other specified disorders of bone density and structure, unspecified site: Secondary | ICD-10-CM | POA: Insufficient documentation

## 2014-01-31 DIAGNOSIS — E785 Hyperlipidemia, unspecified: Secondary | ICD-10-CM | POA: Insufficient documentation

## 2014-07-13 ENCOUNTER — Ambulatory Visit: Payer: Self-pay | Admitting: Oncology

## 2014-07-13 LAB — COMPREHENSIVE METABOLIC PANEL
Albumin: 3.9 g/dL (ref 3.4–5.0)
Alkaline Phosphatase: 79 U/L
Anion Gap: 4 — ABNORMAL LOW (ref 7–16)
BUN: 14 mg/dL (ref 7–18)
Bilirubin,Total: 0.9 mg/dL (ref 0.2–1.0)
Calcium, Total: 8.9 mg/dL (ref 8.5–10.1)
Chloride: 106 mmol/L (ref 98–107)
Co2: 30 mmol/L (ref 21–32)
Creatinine: 0.98 mg/dL (ref 0.60–1.30)
EGFR (African American): >60
EGFR (Non-African Amer.): >60
Glucose: 109 mg/dL — ABNORMAL HIGH (ref 65–99)
Osmolality: 280 (ref 275–301)
Potassium: 4.6 mmol/L (ref 3.5–5.1)
SGOT(AST): 12 U/L — ABNORMAL LOW (ref 15–37)
SGPT (ALT): 22 U/L
Sodium: 140 mmol/L (ref 136–145)
Total Protein: 6.8 g/dL (ref 6.4–8.2)

## 2014-07-13 LAB — CBC CANCER CENTER
Basophil #: 0 x10 3/mm (ref 0.0–0.1)
Basophil %: 0.7 %
Eosinophil #: 0.3 x10 3/mm (ref 0.0–0.7)
Eosinophil %: 4.5 %
HCT: 44 % (ref 40.0–52.0)
HGB: 14.7 g/dL (ref 13.0–18.0)
Lymphocyte #: 1 x10 3/mm (ref 1.0–3.6)
Lymphocyte %: 15 %
MCH: 30 pg (ref 26.0–34.0)
MCHC: 33.5 g/dL (ref 32.0–36.0)
MCV: 90 fL (ref 80–100)
Monocyte #: 0.5 x10 3/mm (ref 0.2–1.0)
Monocyte %: 7.6 %
Neutrophil #: 4.7 x10 3/mm (ref 1.4–6.5)
Neutrophil %: 72.2 %
Platelet: 247 x10 3/mm (ref 150–440)
RBC: 4.91 10*6/uL (ref 4.40–5.90)
RDW: 13.7 % (ref 11.5–14.5)
WBC: 6.6 x10 3/mm (ref 3.8–10.6)

## 2014-07-14 LAB — PSA: PSA: <0.1 ng/mL (ref 0.0–4.0)

## 2014-07-25 ENCOUNTER — Ambulatory Visit: Payer: Self-pay | Admitting: Oncology

## 2014-08-23 ENCOUNTER — Ambulatory Visit
Admit: 2014-08-23 | Disposition: A | Discharge status: Home / Self Care | Payer: Self-pay | Attending: Oncology | Admitting: Oncology

## 2014-10-19 ENCOUNTER — Ambulatory Visit
Admit: 2014-10-19 | Disposition: A | Discharge status: Home / Self Care | Payer: Self-pay | Attending: Oncology | Admitting: Oncology

## 2014-10-19 LAB — CBC CANCER CENTER
Basophil #: 0 x10 3/mm (ref 0.0–0.1)
Basophil %: 0.9 %
Eosinophil #: 0.4 x10 3/mm (ref 0.0–0.7)
Eosinophil %: 7.9 %
HCT: 41.3 % (ref 40.0–52.0)
HGB: 13.9 g/dL (ref 13.0–18.0)
Lymphocyte #: 1.2 x10 3/mm (ref 1.0–3.6)
Lymphocyte %: 26.2 %
MCH: 30.4 pg (ref 26.0–34.0)
MCHC: 33.7 g/dL (ref 32.0–36.0)
MCV: 90 fL (ref 80–100)
Monocyte #: 0.4 x10 3/mm (ref 0.2–1.0)
Monocyte %: 7.6 %
Neutrophil #: 2.6 x10 3/mm (ref 1.4–6.5)
Neutrophil %: 57.4 %
Platelet: 213 x10 3/mm (ref 150–440)
RBC: 4.58 10*6/uL (ref 4.40–5.90)
RDW: 13.2 % (ref 11.5–14.5)
WBC: 4.6 x10 3/mm (ref 3.8–10.6)

## 2014-10-19 LAB — COMPREHENSIVE METABOLIC PANEL
Albumin: 4 g/dL
Alkaline Phosphatase: 63 U/L
Anion Gap: 5 — ABNORMAL LOW (ref 7–16)
BUN: 16 mg/dL
Bilirubin,Total: 1.2 mg/dL
Calcium, Total: 8.8 mg/dL — ABNORMAL LOW
Chloride: 104 mmol/L
Co2: 28 mmol/L
Creatinine: 0.87 mg/dL
EGFR (African American): >60
EGFR (Non-African Amer.): >60
Glucose: 153 mg/dL — ABNORMAL HIGH
Potassium: 4.4 mmol/L
SGOT(AST): 17 U/L
SGPT (ALT): 14 U/L — ABNORMAL LOW
Sodium: 137 mmol/L
Total Protein: 6.4 g/dL — ABNORMAL LOW

## 2014-10-20 LAB — PSA: PSA: <0.1 ng/mL (ref 0.0–4.0)

## 2014-12-21 ENCOUNTER — Other Ambulatory Visit: Payer: Self-pay | Admitting: Oncology

## 2015-01-10 ENCOUNTER — Other Ambulatory Visit: Payer: Self-pay | Admitting: *Deleted

## 2015-01-10 DIAGNOSIS — C61 Malignant neoplasm of prostate: Secondary | ICD-10-CM

## 2015-01-10 NOTE — Progress Notes (Unsigned)
PSN spoke with patient via phone today about co-pay assistance for the drug, Zytiga.  Wynetta Emery and [employer] is working with patient to find copay assistance through [employer].  If funding is not available, Wynetta Emery and Wynetta Emery may assist with cost of the drug.  Patient to inform PSN of outcome.

## 2015-01-11 ENCOUNTER — Inpatient Hospital Stay: Payer: Medicare Other | Attending: Oncology

## 2015-01-11 DIAGNOSIS — Z79899 Other long term (current) drug therapy: Secondary | ICD-10-CM | POA: Insufficient documentation

## 2015-01-11 DIAGNOSIS — C61 Malignant neoplasm of prostate: Secondary | ICD-10-CM | POA: Diagnosis present

## 2015-01-11 DIAGNOSIS — R739 Hyperglycemia, unspecified: Secondary | ICD-10-CM | POA: Diagnosis not present

## 2015-01-11 DIAGNOSIS — Z87891 Personal history of nicotine dependence: Secondary | ICD-10-CM | POA: Insufficient documentation

## 2015-01-11 DIAGNOSIS — I10 Essential (primary) hypertension: Secondary | ICD-10-CM | POA: Diagnosis not present

## 2015-01-11 LAB — CBC WITH DIFFERENTIAL/PLATELET
Basophils Absolute: 0 10*3/uL (ref 0–0.1)
Basophils Relative: 1 %
Eosinophils Absolute: 0.3 10*3/uL (ref 0–0.7)
Eosinophils Relative: 7 %
HCT: 42.7 % (ref 40.0–52.0)
Hemoglobin: 14.4 g/dL (ref 13.0–18.0)
Lymphocytes Relative: 22 %
Lymphs Abs: 1.1 10*3/uL (ref 1.0–3.6)
MCH: 30.4 pg (ref 26.0–34.0)
MCHC: 33.8 g/dL (ref 32.0–36.0)
MCV: 90 fL (ref 80.0–100.0)
Monocytes Absolute: 0.4 10*3/uL (ref 0.2–1.0)
Monocytes Relative: 9 %
Neutro Abs: 3.1 10*3/uL (ref 1.4–6.5)
Neutrophils Relative %: 61 %
Platelets: 234 10*3/uL (ref 150–440)
RBC: 4.75 MIL/uL (ref 4.40–5.90)
RDW: 13.3 % (ref 11.5–14.5)
WBC: 5 10*3/uL (ref 3.8–10.6)

## 2015-01-11 LAB — COMPREHENSIVE METABOLIC PANEL
ALT: 14 U/L — ABNORMAL LOW (ref 17–63)
AST: 18 U/L (ref 15–41)
Albumin: 4.2 g/dL (ref 3.5–5.0)
Alkaline Phosphatase: 77 U/L (ref 38–126)
Anion gap: 4 — ABNORMAL LOW (ref 5–15)
BUN: 17 mg/dL (ref 6–20)
CO2: 29 mmol/L (ref 22–32)
Calcium: 8.7 mg/dL — ABNORMAL LOW (ref 8.9–10.3)
Chloride: 102 mmol/L (ref 101–111)
Creatinine, Ser: 0.99 mg/dL (ref 0.61–1.24)
GFR calc Af Amer: >60 mL/min (ref >60)
GFR calc non Af Amer: >60 mL/min (ref >60)
Glucose, Bld: 123 mg/dL — ABNORMAL HIGH (ref 65–99)
Potassium: 4.2 mmol/L (ref 3.5–5.1)
Sodium: 135 mmol/L (ref 135–145)
Total Bilirubin: 1.6 mg/dL — ABNORMAL HIGH (ref 0.3–1.2)
Total Protein: 6.8 g/dL (ref 6.5–8.1)

## 2015-01-11 LAB — PSA: PSA: <0.01 ng/mL (ref 0.00–4.00)

## 2015-01-13 ENCOUNTER — Telehealth: Payer: Self-pay | Admitting: *Deleted

## 2015-01-13 NOTE — Telephone Encounter (Signed)
Called pt to inform him that we are reapplying him for [employer] patient assistance program. Instructed pt that needs to bring in most recent federal tax return into clinic at next appt. Left message with patient and informed pt to callback if has any questions.

## 2015-01-18 ENCOUNTER — Encounter: Payer: Self-pay | Admitting: Oncology

## 2015-01-18 ENCOUNTER — Inpatient Hospital Stay (HOSPITAL_BASED_OUTPATIENT_CLINIC_OR_DEPARTMENT_OTHER): Payer: Medicare Other | Admitting: Oncology

## 2015-01-18 VITALS — BP 145/92 | HR 66 | Temp 95.9°F | Wt 165.8 lb

## 2015-01-18 DIAGNOSIS — R739 Hyperglycemia, unspecified: Secondary | ICD-10-CM | POA: Diagnosis not present

## 2015-01-18 DIAGNOSIS — C61 Malignant neoplasm of prostate: Secondary | ICD-10-CM

## 2015-01-18 DIAGNOSIS — I10 Essential (primary) hypertension: Secondary | ICD-10-CM

## 2015-01-18 DIAGNOSIS — Z87891 Personal history of nicotine dependence: Secondary | ICD-10-CM

## 2015-01-18 DIAGNOSIS — Z79899 Other long term (current) drug therapy: Secondary | ICD-10-CM

## 2015-01-18 NOTE — Progress Notes (Signed)
[place] @ [facility] [phone]  Fax:(336) [place]: [birth year]  MR#: [id number]  YSH#:[id number]  Patient Care Team: Juluis Pitch, MD as PCP - General (Family Medicine)  CHIEF COMPLAINT:  Chief Complaint  Patient presents with  . Follow-up     No history exists.  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  No flowsheet data found.  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  No chills.  No fever.  PSA is less than 0.1.  Bilirubin is slightly elevated.  The blood sugar is slightly elevated. He should not is here for ongoing evaluation and treatment consideration taking ZYTIGA.  And a small dose of prednisone. REVIEW OF SYSTEMS:   GENERAL:  Feels good.  Active.  No fevers, sweats or weight loss. PERFORMANCE STATUS (ECOG):  01 HEENT:  No visual changes, runny nose, sore throat, mouth sores or tenderness. Lungs: No shortness of breath or cough.  No hemoptysis. Cardiac:  No chest pain, palpitations, orthopnea, or PND. GI:  No nausea, vomiting, diarrhea, constipation, melena or hematochezia. GU:  No urgency, frequency, dysuria, or hematuria. Musculoskeletal:  No back pain.  No joint pain.  No muscle tenderness. Extremities:  No pain or swelling. Skin:  No rashes or skin changes. Neuro:  No headache, numbness or weakness, balance or coordination issues. Endocrine:  No diabetes, thyroid issues, hot flashes or night sweats. Psych:  No mood changes, depression or anxiety. Pain:  No focal pain. Review of systems:  All other systems reviewed and found to be negative. As per HPI. Otherwise, a complete review of systems is negatve.  PAST MEDICAL HISTORY: No past medical history on file.  PAST SURGICAL HISTORY: No past surgical history on file.  There is no significant family history of breast cancer, ovarian cancer, colon cancer ADVANCED DIRECTIVES:  No  flowsheet data found.  HEALTH MAINTENANCE: History  Substance Use Topics  . Smoking status: Former Research scientist (life sciences)  . Smokeless tobacco: Not on file  . Alcohol Use: Not on file      No Known Allergies  Current Outpatient Prescriptions  Medication Sig Dispense Refill  . abiraterone Acetate (ZYTIGA) 250 MG tablet Take by mouth.    Marland Kitchen atorvastatin (LIPITOR) 20 MG tablet TAKE 1/2 TABLET ONCE DAILY    . calcium-vitamin D (SM CALCIUM 500/VITAMIN D3) 500-400 MG-UNIT per tablet Take by mouth.    . predniSONE (DELTASONE) 5 MG tablet Take by mouth.     No current facility-administered medications for this visit.    OBJECTIVE:  Filed Vitals:   01/18/15 0858  BP: 145/92  Pulse: 66  Temp: 95.9 F (35.5 C)     There is no height on file to calculate BMI.    ECOG FS:0 - Asymptomatic  PHYSICAL EXAM: GENERAL:  Well developed, well nourished, sitting comfortably in the exam room in no acute distress. MENTAL STATUS:  Alert and oriented to person, place and time.  ENT:  Oropharynx clear without lesion.  Tongue normal. Mucous membranes moist.  RESPIRATORY:  Clear to auscultation without rales, wheezes or rhonchi. CARDIOVASCULAR:  Regular rate and rhythm without murmur, rub or gallop. BREAST: Lateral gynecomastia grade 1 ABDOMEN:  Soft, non-tender, with active bowel sounds, and no hepatosplenomegaly.  No masses. BACK:  No CVA tenderness.  No tenderness on percussion of the back or rib cage. SKIN:  No rashes, ulcers or lesions. EXTREMITIES: No edema, no skin discoloration or tenderness.  No palpable cords. LYMPH NODES: No palpable cervical, supraclavicular, axillary or inguinal adenopathy  NEUROLOGICAL: Unremarkable. PSYCH:  Appropriate.   LAB RESULTS:  No visits with results within 2 Day(s) from this visit. Latest known visit with results is:  Appointment on 01/11/2015  Component Date Value Ref Range Status  . WBC 01/11/2015 5.0  3.8 - 10.6 K/uL Final  . RBC 01/11/2015 4.75  4.40 - 5.90  MIL/uL Final  . Hemoglobin 01/11/2015 14.4  13.0 - 18.0 g/dL Final  . HCT 01/11/2015 42.7  40.0 - 52.0 % Final  . MCV 01/11/2015 90.0  80.0 - 100.0 fL Final  . MCH 01/11/2015 30.4  26.0 - 34.0 pg Final  . MCHC 01/11/2015 33.8  32.0 - 36.0 g/dL Final  . RDW 01/11/2015 13.3  11.5 - 14.5 % Final  . Platelets 01/11/2015 234  150 - 440 K/uL Final  . Neutrophils Relative % 01/11/2015 61   Final  . Neutro Abs 01/11/2015 3.1  1.4 - 6.5 K/uL Final  . Lymphocytes Relative 01/11/2015 22   Final  . Lymphs Abs 01/11/2015 1.1  1.0 - 3.6 K/uL Final  . Monocytes Relative 01/11/2015 9   Final  . Monocytes Absolute 01/11/2015 0.4  0.2 - 1.0 K/uL Final  . Eosinophils Relative 01/11/2015 7   Final  . Eosinophils Absolute 01/11/2015 0.3  0 - 0.7 K/uL Final  . Basophils Relative 01/11/2015 1   Final  . Basophils Absolute 01/11/2015 0.0  0 - 0.1 K/uL Final  . Sodium 01/11/2015 135  135 - 145 mmol/L Final  . Potassium 01/11/2015 4.2  3.5 - 5.1 mmol/L Final  . Chloride 01/11/2015 102  101 - 111 mmol/L Final  . CO2 01/11/2015 29  22 - 32 mmol/L Final  . Glucose, Bld 01/11/2015 123* 65 - 99 mg/dL Final  . BUN 01/11/2015 17  6 - 20 mg/dL Final  . Creatinine, Ser 01/11/2015 0.99  0.61 - 1.24 mg/dL Final  . Calcium 01/11/2015 8.7* 8.9 - 10.3 mg/dL Final  . Total Protein 01/11/2015 6.8  6.5 - 8.1 g/dL Final  . Albumin 01/11/2015 4.2  3.5 - 5.0 g/dL Final  . AST 01/11/2015 18  15 - 41 U/L Final  . ALT 01/11/2015 14* 17 - 63 U/L Final  . Alkaline Phosphatase 01/11/2015 77  38 - 126 U/L Final  . Total Bilirubin 01/11/2015 1.6* 0.3 - 1.2 mg/dL Final  . GFR calc non Af Amer 01/11/2015 >60  >60 mL/min Final  . GFR calc Af Amer 01/11/2015 >60  >60 mL/min Final   Comment: (NOTE) The eGFR has been calculated using the CKD EPI equation. This calculation has not been validated in all clinical situations. eGFR's persistently <60 mL/min signify possible Chronic Kidney Disease.   . Anion gap 01/11/2015 4* 5 - 15 Final    . PSA 01/11/2015 <0.01  0.00 - 4.00 ng/mL Final   Comment: (NOTE) While PSA levels of <=4.0 ng/ml are reported as reference range, some men with levels below 4.0 ng/ml can have prostate cancer and many men with PSA above 4.0 ng/ml do not have prostate cancer.  Other tests such as free PSA, age specific reference ranges, PSA velocity and PSA doubling time may be helpful especially in men less than 32 years old. Performed at [facility]       ASSESSMENT: Recurrent carcinoma prostate responding to [facility]  PSA is less than 0.1 Bilirubin is slightly elevated with 1.6 Blood sugar slightly elevated 123 Blood pressure is slightly  elevated today  MEDICAL DECISION MAKING:  Continue ZYTIGA Check testosterone liver during next appointment Consider bone density study if it is not done within the last 2 years  Patient expressed understanding and was in agreement with this plan. He also understands that He can call clinic at any time with any questions, concerns, or complaints.    No matching staging information was found for the patient.  Forest Gleason, MD   01/18/2015 9:24 AM

## 2015-01-18 NOTE — Progress Notes (Signed)
Patient does have living will.  Former smoker. 

## 2015-02-02 ENCOUNTER — Telehealth: Payer: Self-pay | Admitting: Oncology

## 2015-04-12 ENCOUNTER — Inpatient Hospital Stay: Payer: Medicare Other | Attending: Oncology

## 2015-04-12 DIAGNOSIS — C61 Malignant neoplasm of prostate: Secondary | ICD-10-CM | POA: Insufficient documentation

## 2015-04-18 ENCOUNTER — Other Ambulatory Visit: Payer: Self-pay | Admitting: Oncology

## 2015-04-19 ENCOUNTER — Inpatient Hospital Stay: Payer: Medicare Other

## 2015-04-19 ENCOUNTER — Inpatient Hospital Stay: Payer: Medicare Other | Attending: Oncology | Admitting: Oncology

## 2015-04-19 VITALS — BP 133/82 | HR 62 | Temp 96.1°F | Wt 164.0 lb

## 2015-04-19 DIAGNOSIS — N393 Stress incontinence (female) (male): Secondary | ICD-10-CM | POA: Insufficient documentation

## 2015-04-19 DIAGNOSIS — Z87891 Personal history of nicotine dependence: Secondary | ICD-10-CM | POA: Diagnosis not present

## 2015-04-19 DIAGNOSIS — C61 Malignant neoplasm of prostate: Secondary | ICD-10-CM | POA: Diagnosis present

## 2015-04-19 DIAGNOSIS — Z7952 Long term (current) use of systemic steroids: Secondary | ICD-10-CM | POA: Diagnosis not present

## 2015-04-19 DIAGNOSIS — M8580 Other specified disorders of bone density and structure, unspecified site: Secondary | ICD-10-CM | POA: Diagnosis not present

## 2015-04-19 DIAGNOSIS — Z79899 Other long term (current) drug therapy: Secondary | ICD-10-CM | POA: Diagnosis not present

## 2015-04-19 LAB — CBC WITH DIFFERENTIAL/PLATELET
Basophils Absolute: 0.1 10*3/uL (ref 0–0.1)
Basophils Relative: 1 %
Eosinophils Absolute: 0.3 10*3/uL (ref 0–0.7)
Eosinophils Relative: 7 %
HCT: 43.7 % (ref 40.0–52.0)
Hemoglobin: 14.9 g/dL (ref 13.0–18.0)
Lymphocytes Relative: 21 %
Lymphs Abs: 1 10*3/uL (ref 1.0–3.6)
MCH: 30.5 pg (ref 26.0–34.0)
MCHC: 34.1 g/dL (ref 32.0–36.0)
MCV: 89.5 fL (ref 80.0–100.0)
Monocytes Absolute: 0.3 10*3/uL (ref 0.2–1.0)
Monocytes Relative: 6 %
Neutro Abs: 3.2 10*3/uL (ref 1.4–6.5)
Neutrophils Relative %: 65 %
Platelets: 243 10*3/uL (ref 150–440)
RBC: 4.88 MIL/uL (ref 4.40–5.90)
RDW: 13.5 % (ref 11.5–14.5)
WBC: 4.9 10*3/uL (ref 3.8–10.6)

## 2015-04-19 LAB — COMPREHENSIVE METABOLIC PANEL
ALT: 14 U/L — ABNORMAL LOW (ref 17–63)
AST: 20 U/L (ref 15–41)
Albumin: 3.9 g/dL (ref 3.5–5.0)
Alkaline Phosphatase: 73 U/L (ref 38–126)
Anion gap: 6 (ref 5–15)
BUN: 16 mg/dL (ref 6–20)
CO2: 29 mmol/L (ref 22–32)
Calcium: 8.5 mg/dL — ABNORMAL LOW (ref 8.9–10.3)
Chloride: 101 mmol/L (ref 101–111)
Creatinine, Ser: 0.98 mg/dL (ref 0.61–1.24)
GFR calc Af Amer: >60 mL/min (ref >60)
GFR calc non Af Amer: >60 mL/min (ref >60)
Glucose, Bld: 174 mg/dL — ABNORMAL HIGH (ref 65–99)
Potassium: 3.8 mmol/L (ref 3.5–5.1)
Sodium: 136 mmol/L (ref 135–145)
Total Bilirubin: 1.4 mg/dL — ABNORMAL HIGH (ref 0.3–1.2)
Total Protein: 6.7 g/dL (ref 6.5–8.1)

## 2015-04-19 LAB — PSA: PSA: <0.01 ng/mL (ref 0.00–4.00)

## 2015-04-19 NOTE — Progress Notes (Signed)
Patient states he has an area under his left breast that he wants MD to check.  When lifting something he can feel it.  Describes it as more of a discomfort.

## 2015-04-19 NOTE — Progress Notes (Signed)
[facility]  Telephone:([phone]  Fax:(336) [place] DOB: 26-Dec-1945  MR#: [id number]  SEG#:[id number]  Patient Care Team: Juluis Pitch, MD as PCP - General (Family Medicine)  CHIEF COMPLAINT:  Chief Complaint  Patient presents with  . OTHER   Routine follow-up regarding prostate cancer.  INTERVAL HISTORY:  Patient is here for further follow-up and treatment consideration regarding prostate cancer. Patient is currently on Zytiga And prednisone, his last PSA in July 2016 was undetectable at that time. Patient reports overall feeling very well. No significant change in urinary symptoms. He has some occasional stress incontinence. He denies any new bone pain, fever, hematuria, or other acute complaints. Patient does report having a "burning" discomfort just under the left breast, patient states he's had this for several days but is overall improved and cannot find the tender spot at this time.  REVIEW OF SYSTEMS:   Review of Systems  Constitutional: Negative for fever, chills, weight loss, malaise/fatigue and diaphoresis.  HENT: Negative for congestion, ear discharge, ear pain, hearing loss, nosebleeds, sore throat and tinnitus.   Eyes: Negative for blurred vision, double vision, photophobia, pain, discharge and redness.  Respiratory: Negative for cough, hemoptysis, sputum production, shortness of breath, wheezing and stridor.   Cardiovascular: Negative for chest pain, palpitations, orthopnea, claudication, leg swelling and PND.  Gastrointestinal: Negative for heartburn, nausea, vomiting, abdominal pain, diarrhea, constipation, blood in stool and melena.  Genitourinary: Negative.   Musculoskeletal: Negative.   Skin: Negative.   Neurological: Negative for dizziness, tingling, focal weakness, seizures, weakness and headaches.  Endo/Heme/Allergies: Does not bruise/bleed easily.  Psychiatric/Behavioral: Negative for depression. The patient is not  nervous/anxious and does not have insomnia.     As per HPI. Otherwise, a complete review of systems is negatve.  ONCOLOGY HISTORY: Stage IV prostate cancer  PAST MEDICAL HISTORY: No past medical history on file.  PAST SURGICAL HISTORY: No past surgical history on file.  FAMILY HISTORY No family history on file.  GYNECOLOGIC HISTORY:  No LMP for male patient.     ADVANCED DIRECTIVES:    HEALTH MAINTENANCE: Social History  Substance Use Topics  . Smoking status: Former Research scientist (life sciences)  . Smokeless tobacco: Not on file  . Alcohol Use: Not on file     Colonoscopy:  PAP:  Bone density: March 2015  Lipid panel:  No Known Allergies  Current Outpatient Prescriptions  Medication Sig Dispense Refill  . atorvastatin (LIPITOR) 20 MG tablet TAKE 1/2 TABLET ONCE DAILY    . calcium-vitamin D (SM CALCIUM 500/VITAMIN D3) 500-400 MG-UNIT per tablet Take by mouth.    . predniSONE (DELTASONE) 5 MG tablet Take by mouth.    Marland Kitchen ZYTIGA 250 MG tablet TAKE FOUR TABLETS (1,000 MG) ONCE EVERY DAY 120 tablet 2   No current facility-administered medications for this visit.    OBJECTIVE: BP 133/82 mmHg  Pulse 62  Temp(Src) 96.1 F (35.6 C) (Tympanic)  Wt 164 lb 0.4 oz (74.4 kg)   There is no height on file to calculate BMI.    ECOG FS:0 - Asymptomatic  General: Well-developed, well-nourished, no acute distress. Eyes: Pink conjunctiva, anicteric sclera. HEENT: Normocephalic, moist mucous membranes, clear oropharnyx. Lungs: Clear to auscultation bilaterally. Heart: Regular rate and rhythm. No rubs, murmurs, or gallops. Abdomen: Soft, nontender, nondistended. No organomegaly noted, normoactive bowel sounds. Musculoskeletal: No edema, cyanosis, or clubbing. Neuro: Alert, answering all questions appropriately. Cranial nerves grossly intact. Skin: No rashes or petechiae noted. Psych: Normal affect. Lymphatics:  No cervical, clavicular, axillary LAD.   LAB RESULTS:  Appointment on 04/19/2015    Component Date Value Ref Range Status  . WBC 04/19/2015 4.9  3.8 - 10.6 K/uL Final  . RBC 04/19/2015 4.88  4.40 - 5.90 MIL/uL Final  . Hemoglobin 04/19/2015 14.9  13.0 - 18.0 g/dL Final  . HCT 04/19/2015 43.7  40.0 - 52.0 % Final  . MCV 04/19/2015 89.5  80.0 - 100.0 fL Final  . MCH 04/19/2015 30.5  26.0 - 34.0 pg Final  . MCHC 04/19/2015 34.1  32.0 - 36.0 g/dL Final  . RDW 04/19/2015 13.5  11.5 - 14.5 % Final  . Platelets 04/19/2015 243  150 - 440 K/uL Final  . Neutrophils Relative % 04/19/2015 65   Final  . Neutro Abs 04/19/2015 3.2  1.4 - 6.5 K/uL Final  . Lymphocytes Relative 04/19/2015 21   Final  . Lymphs Abs 04/19/2015 1.0  1.0 - 3.6 K/uL Final  . Monocytes Relative 04/19/2015 6   Final  . Monocytes Absolute 04/19/2015 0.3  0.2 - 1.0 K/uL Final  . Eosinophils Relative 04/19/2015 7   Final  . Eosinophils Absolute 04/19/2015 0.3  0 - 0.7 K/uL Final  . Basophils Relative 04/19/2015 1   Final  . Basophils Absolute 04/19/2015 0.1  0 - 0.1 K/uL Final  . Sodium 04/19/2015 136  135 - 145 mmol/L Final  . Potassium 04/19/2015 3.8  3.5 - 5.1 mmol/L Final  . Chloride 04/19/2015 101  101 - 111 mmol/L Final  . CO2 04/19/2015 29  22 - 32 mmol/L Final  . Glucose, Bld 04/19/2015 174* 65 - 99 mg/dL Final  . BUN 04/19/2015 16  6 - 20 mg/dL Final  . Creatinine, Ser 04/19/2015 0.98  0.61 - 1.24 mg/dL Final  . Calcium 04/19/2015 8.5* 8.9 - 10.3 mg/dL Final  . Total Protein 04/19/2015 6.7  6.5 - 8.1 g/dL Final  . Albumin 04/19/2015 3.9  3.5 - 5.0 g/dL Final  . AST 04/19/2015 20  15 - 41 U/L Final  . ALT 04/19/2015 14* 17 - 63 U/L Final  . Alkaline Phosphatase 04/19/2015 73  38 - 126 U/L Final  . Total Bilirubin 04/19/2015 1.4* 0.3 - 1.2 mg/dL Final  . GFR calc non Af Amer 04/19/2015 >60  >60 mL/min Final  . GFR calc Af Amer 04/19/2015 >60  >60 mL/min Final   Comment: (NOTE) The eGFR has been calculated using the CKD EPI equation. This calculation has not been validated in all clinical  situations. eGFR's persistently <60 mL/min signify possible Chronic Kidney Disease.   . Anion gap 04/19/2015 6  5 - 15 Final    STUDIES: No results found.  ASSESSMENT:  Stage IV prostate cancer.  PLAN:   1. Prostate CA. Patient is currently under treatment with Zytiga and prednisone in an tolerating very well. Most recent PSA in July was reported as undetectable. His eye had any change in urinary symptoms. Patient's LFTs remain within normal limits with bilirubin slightly elevated at 1.4, but overall improved from last check in July. 2. Osteopenia. Patient had a recent DEXA scan on 09/14/2013. T-scores of -1.5 in the hip and -0.7 in the spine, down 1-3% from previously. FRAX scores were 1.8/9.6% hip/major fracture risk. No significant loss of height. He is taking calcium and vitamin D twice daily usually.    Patient expressed understanding and was in agreement with this plan. He also understands that He can call clinic at any time with any questions, concerns,  or complaints.   Dr. Oliva Bustard was available for consultation and review of plan of care for this patient.   Evlyn Kanner, NP   04/19/2015 10:25 AM

## 2015-04-20 LAB — TESTOSTERONE: Testosterone: <3 ng/dL — ABNORMAL LOW (ref 348–1197)

## 2015-06-19 ENCOUNTER — Other Ambulatory Visit: Payer: Self-pay | Admitting: Oncology

## 2015-07-12 ENCOUNTER — Inpatient Hospital Stay: Payer: Medicare Other | Attending: Oncology

## 2015-07-12 DIAGNOSIS — C61 Malignant neoplasm of prostate: Secondary | ICD-10-CM | POA: Insufficient documentation

## 2015-07-12 DIAGNOSIS — Z7952 Long term (current) use of systemic steroids: Secondary | ICD-10-CM | POA: Insufficient documentation

## 2015-07-12 DIAGNOSIS — Z79899 Other long term (current) drug therapy: Secondary | ICD-10-CM | POA: Insufficient documentation

## 2015-07-12 DIAGNOSIS — R002 Palpitations: Secondary | ICD-10-CM | POA: Insufficient documentation

## 2015-07-12 DIAGNOSIS — R0602 Shortness of breath: Secondary | ICD-10-CM | POA: Insufficient documentation

## 2015-07-12 DIAGNOSIS — Z87891 Personal history of nicotine dependence: Secondary | ICD-10-CM | POA: Insufficient documentation

## 2015-07-12 LAB — PSA: PSA: <0.01 ng/mL (ref 0.00–4.00)

## 2015-07-12 LAB — CBC WITH DIFFERENTIAL/PLATELET
Basophils Absolute: 0.1 10*3/uL (ref 0–0.1)
Basophils Relative: 1 %
Eosinophils Absolute: 0.3 10*3/uL (ref 0–0.7)
Eosinophils Relative: 7 %
HCT: 41.4 % (ref 40.0–52.0)
Hemoglobin: 13.8 g/dL (ref 13.0–18.0)
Lymphocytes Relative: 20 %
Lymphs Abs: 0.8 10*3/uL — ABNORMAL LOW (ref 1.0–3.6)
MCH: 30.1 pg (ref 26.0–34.0)
MCHC: 33.4 g/dL (ref 32.0–36.0)
MCV: 90.1 fL (ref 80.0–100.0)
Monocytes Absolute: 0.3 10*3/uL (ref 0.2–1.0)
Monocytes Relative: 9 %
Neutro Abs: 2.6 10*3/uL (ref 1.4–6.5)
Neutrophils Relative %: 63 %
Platelets: 250 10*3/uL (ref 150–440)
RBC: 4.59 MIL/uL (ref 4.40–5.90)
RDW: 13.9 % (ref 11.5–14.5)
WBC: 4.1 10*3/uL (ref 3.8–10.6)

## 2015-07-12 LAB — COMPREHENSIVE METABOLIC PANEL
ALT: 13 U/L — ABNORMAL LOW (ref 17–63)
AST: 14 U/L — ABNORMAL LOW (ref 15–41)
Albumin: 4.1 g/dL (ref 3.5–5.0)
Alkaline Phosphatase: 62 U/L (ref 38–126)
Anion gap: 2 — ABNORMAL LOW (ref 5–15)
BUN: 16 mg/dL (ref 6–20)
CO2: 30 mmol/L (ref 22–32)
Calcium: 8.8 mg/dL — ABNORMAL LOW (ref 8.9–10.3)
Chloride: 103 mmol/L (ref 101–111)
Creatinine, Ser: 0.86 mg/dL (ref 0.61–1.24)
GFR calc Af Amer: >60 mL/min (ref >60)
GFR calc non Af Amer: >60 mL/min (ref >60)
Glucose, Bld: 126 mg/dL — ABNORMAL HIGH (ref 65–99)
Potassium: 3.9 mmol/L (ref 3.5–5.1)
Sodium: 135 mmol/L (ref 135–145)
Total Bilirubin: 1.3 mg/dL — ABNORMAL HIGH (ref 0.3–1.2)
Total Protein: 6.6 g/dL (ref 6.5–8.1)

## 2015-07-17 ENCOUNTER — Other Ambulatory Visit: Payer: Self-pay | Admitting: Oncology

## 2015-07-17 DIAGNOSIS — C61 Malignant neoplasm of prostate: Secondary | ICD-10-CM

## 2015-07-17 NOTE — Telephone Encounter (Signed)
Refill request sent to [facility].

## 2015-07-19 ENCOUNTER — Inpatient Hospital Stay (HOSPITAL_BASED_OUTPATIENT_CLINIC_OR_DEPARTMENT_OTHER): Payer: Medicare Other | Admitting: Oncology

## 2015-07-19 ENCOUNTER — Encounter: Payer: Self-pay | Admitting: Oncology

## 2015-07-19 VITALS — BP 145/94 | HR 159 | Temp 98.9°F | Resp 20 | Wt 165.8 lb

## 2015-07-19 DIAGNOSIS — R002 Palpitations: Secondary | ICD-10-CM | POA: Diagnosis not present

## 2015-07-19 DIAGNOSIS — C61 Malignant neoplasm of prostate: Secondary | ICD-10-CM

## 2015-07-19 DIAGNOSIS — R0602 Shortness of breath: Secondary | ICD-10-CM

## 2015-07-19 DIAGNOSIS — Z7952 Long term (current) use of systemic steroids: Secondary | ICD-10-CM

## 2015-07-19 DIAGNOSIS — Z79899 Other long term (current) drug therapy: Secondary | ICD-10-CM

## 2015-07-19 DIAGNOSIS — Z87891 Personal history of nicotine dependence: Secondary | ICD-10-CM

## 2015-07-19 MED ORDER — ABIRATERONE ACETATE 250 MG PO TABS: 1000.0000 mg | ORAL_TABLET | Freq: Every day | ORAL | Status: DC

## 2015-07-19 NOTE — Progress Notes (Signed)
Patient states he started having heart palpitations this morning.  States he felt a little SOB coming up the stairs this morning.  BP 145/94  HR 159.  Denies any lightheadedness.  States he has had about 4 episodes like this over the last few months.  Does not have a cardiologist.  Repeat vitals BP 121/90   HR 170.

## 2015-07-19 NOTE — Progress Notes (Signed)
[place] @ [facility] [phone]  Fax:([phone]) [place]: Dec 30, 1945  MR#: [id number]  HWY#:[id number]  Patient Care Team: Brandon Pitch, MD as PCP - General (Family Medicine)  CHIEF COMPLAINT:  Chief Complaint  Patient presents with  . Prostate Cancer     No history exists.  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  No flowsheet data found.  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  No chills.  No fever.  PSA is less than 0.1.  Bilirubin is slightly elevated.  The blood sugar is slightly elevated. He should not is here for ongoing evaluation and treatment consideration taking ZYTIGA.  And a small dose of prednisone.,   Patient states he started having heart palpitations this morning. States he felt a little SOB coming up the stairs this morning. BP 145/94 HR 159. Denies any lightheadedness. States he has had about 4 episodes like this over the last few months. Does not have a cardiologist. Repeat vitals BP 121/90 HR 170.  Recent has no problem with bony pain.  PSA 3 months ago was less than 0.01.  No chest pain.  Palpitation off and on.  REVIEW OF SYSTEMS:   GENERAL:  Feels good.  Active.  No fevers, sweats or weight loss. PERFORMANCE STATUS (ECOG):  01 HEENT:  No visual changes, runny nose, sore throat, mouth sores or tenderness. Lungs: No shortness of breath or cough.  No hemoptysis. Cardiac:  No chest pain, palpitations, orthopnea, or PND. GI:  No nausea, vomiting, diarrhea, constipation, melena or hematochezia. GU:  No urgency, frequency, dysuria, or hematuria. Musculoskeletal:  No back pain.  No joint pain.  No muscle tenderness. Extremities:  No pain or swelling. Skin:  No rashes or skin changes. Neuro:  No headache, numbness or weakness, balance or coordination issues. Endocrine:  No diabetes, thyroid issues, hot flashes or night sweats. Psych:   No mood changes, depression or anxiety. Pain:  No focal pain. Review of systems:  All other systems reviewed and found to be negative. As per HPI. Otherwise, a complete review of systems is negatve.  PAST MEDICAL HISTORY: No past medical history on file.  PAST SURGICAL HISTORY: No past surgical history on file.  There is no significant family history of breast cancer, ovarian cancer, colon cancer ADVANCED DIRECTIVES:  No flowsheet data found.  HEALTH MAINTENANCE: Social History  Substance Use Topics  . Smoking status: Former Research scientist (life sciences)  . Smokeless tobacco: None  . Alcohol Use: None      No Known Allergies  Current Outpatient Prescriptions  Medication Sig Dispense Refill  . abiraterone Acetate (ZYTIGA) 250 MG tablet Take 4 tablets (1,000 mg total) by mouth daily. Take on an empty stomach 1 hour before or 2 hours after a meal 120 tablet 2  . atorvastatin (LIPITOR) 20 MG tablet TAKE 1/2 TABLET ONCE DAILY    . calcium-vitamin D (SM CALCIUM 500/VITAMIN D3) 500-400 MG-UNIT per tablet Take by mouth.    . predniSONE (DELTASONE) 5 MG tablet TAKE ONE TABLET EVERY TWELVE HOURS 60 tablet 3   No current facility-administered medications for this visit.    OBJECTIVE:  Filed Vitals:   07/19/15 0946  BP: 145/94  Pulse: 159  Temp: 98.9 F (37.2 C)  Resp: 20     There is no height on file to calculate BMI.    ECOG FS:0 - Asymptomatic  PHYSICAL EXAM: GENERAL:  Well developed,  well nourished, sitting comfortably in the exam room in no acute distress. MENTAL STATUS:  Alert and oriented to person, place and time.  ENT:  Oropharynx clear without lesion.  Tongue normal. Mucous membranes moist.  RESPIRATORY:  Clear to auscultation without rales, wheezes or rhonchi. CARDIOVASCULAR:  Tachycardia with irregular heartbeat BREAST: Lateral gynecomastia grade 1 ABDOMEN:  Soft, non-tender, with active bowel sounds, and no hepatosplenomegaly.  No masses. BACK:  No CVA tenderness.  No tenderness on  percussion of the back or rib cage. SKIN:  No rashes, ulcers or lesions. EXTREMITIES: No edema, no skin discoloration or tenderness.  No palpable cords. LYMPH NODES: No palpable cervical, supraclavicular, axillary or inguinal adenopathy  NEUROLOGICAL: Unremarkable. PSYCH:  Appropriate.   LAB RESULTS:  No visits with results within 2 Day(s) from this visit. Latest known visit with results is:  Appointment on 07/12/2015  Component Date Value Ref Range Status  . WBC 07/12/2015 4.1  3.8 - 10.6 K/uL Final  . RBC 07/12/2015 4.59  4.40 - 5.90 MIL/uL Final  . Hemoglobin 07/12/2015 13.8  13.0 - 18.0 g/dL Final  . HCT 07/12/2015 41.4  40.0 - 52.0 % Final  . MCV 07/12/2015 90.1  80.0 - 100.0 fL Final  . MCH 07/12/2015 30.1  26.0 - 34.0 pg Final  . MCHC 07/12/2015 33.4  32.0 - 36.0 g/dL Final  . RDW 07/12/2015 13.9  11.5 - 14.5 % Final  . Platelets 07/12/2015 250  150 - 440 K/uL Final  . Neutrophils Relative % 07/12/2015 63   Final  . Neutro Abs 07/12/2015 2.6  1.4 - 6.5 K/uL Final  . Lymphocytes Relative 07/12/2015 20   Final  . Lymphs Abs 07/12/2015 0.8* 1.0 - 3.6 K/uL Final  . Monocytes Relative 07/12/2015 9   Final  . Monocytes Absolute 07/12/2015 0.3  0.2 - 1.0 K/uL Final  . Eosinophils Relative 07/12/2015 7   Final  . Eosinophils Absolute 07/12/2015 0.3  0 - 0.7 K/uL Final  . Basophils Relative 07/12/2015 1   Final  . Basophils Absolute 07/12/2015 0.1  0 - 0.1 K/uL Final  . Sodium 07/12/2015 135  135 - 145 mmol/L Final   RESULTS VERIFIED BY REPEAT TESTING  . Potassium 07/12/2015 3.9  3.5 - 5.1 mmol/L Final  . Chloride 07/12/2015 103  101 - 111 mmol/L Final  . CO2 07/12/2015 30  22 - 32 mmol/L Final  . Glucose, Bld 07/12/2015 126* 65 - 99 mg/dL Final  . BUN 07/12/2015 16  6 - 20 mg/dL Final  . Creatinine, Ser 07/12/2015 0.86  0.61 - 1.24 mg/dL Final  . Calcium 07/12/2015 8.8* 8.9 - 10.3 mg/dL Final  . Total Protein 07/12/2015 6.6  6.5 - 8.1 g/dL Final  . Albumin 07/12/2015 4.1   3.5 - 5.0 g/dL Final  . AST 07/12/2015 14* 15 - 41 U/L Final  . ALT 07/12/2015 13* 17 - 63 U/L Final  . Alkaline Phosphatase 07/12/2015 62  38 - 126 U/L Final  . Total Bilirubin 07/12/2015 1.3* 0.3 - 1.2 mg/dL Final  . GFR calc non Af Amer 07/12/2015 >60  >60 mL/min Final  . GFR calc Af Amer 07/12/2015 >60  >60 mL/min Final   Comment: (NOTE) The eGFR has been calculated using the CKD EPI equation. This calculation has not been validated in all clinical situations. eGFR's persistently <60 mL/min signify possible Chronic Kidney Disease.   . Anion gap 07/12/2015 2* 5 - 15 Final  . PSA 07/12/2015 <0.01  0.00 - 4.00  ng/mL Final   Comment: (NOTE) While PSA levels of <=4.0 ng/ml are reported as reference range, some men with levels below 4.0 ng/ml can have prostate cancer and many men with PSA above 4.0 ng/ml do not have prostate cancer.  Other tests such as free PSA, age specific reference ranges, PSA velocity and PSA doubling time may be helpful especially in men less than 78 years old. Performed at [facility]       ASSESSMENT: Recurrent carcinoma prostate responding to [place]  PSA is less than 0.01 Bilirubin is slightly elevated with 1.3  MEDICAL DECISION MAKING:  Continue ZYTIGA Check testosterone liver during next appointment Consider bone density study if it is not done within the last 2 years 2.  Patient has palpitation off and on has some Tachycardia today with some irregular heartbeat.  Stability of atrial fibrillation or flutter could not be ruled out.  Patient has been advised to go to walk-in in [facility] clinic as patient's primary care physician is Dr. Lovie Macadamia.  Patient will be evaluated there.  2.  There is no evidence of recurrent or progressive disease. He  also had some problem regarding co-pay for ZYTIGA which has gone up to $3000 at present time. Social service will evaluate situation as well as prescription has been sent to her another company as  suggested by [employer] Continue ZYTIGA. Duration of visit is 73mnutes and 50% of time was spent discussing Co-pay is here.  Social worker evaluation regarding co-pay.  Palpitation and need for him to get EKG done right away and further evaluation as soon as possible. Patient expressed understanding and was in agreement with this plan. He also understands that He can call clinic at any time with any questions, concerns, or complaints.    No matching staging information was found for the patient.  JForest Gleason MD   07/19/2015 10:25 AM

## 2015-08-14 ENCOUNTER — Telehealth: Payer: Self-pay | Admitting: *Deleted

## 2015-08-14 DIAGNOSIS — C61 Malignant neoplasm of prostate: Secondary | ICD-10-CM

## 2015-08-14 MED ORDER — ABIRATERONE ACETATE 250 MG PO TABS: 1000.0000 mg | ORAL_TABLET | Freq: Every day | ORAL | Status: DC

## 2015-08-14 NOTE — Telephone Encounter (Signed)
Refill for zytiga escribed to [facility].

## 2015-09-05 ENCOUNTER — Other Ambulatory Visit: Payer: Self-pay | Admitting: *Deleted

## 2015-09-05 MED ORDER — PREDNISONE 5 MG PO TABS: ORAL_TABLET | ORAL | Status: DC

## 2015-09-07 ENCOUNTER — Other Ambulatory Visit: Payer: Self-pay | Admitting: *Deleted

## 2015-09-07 MED ORDER — PREDNISONE 5 MG PO TABS: ORAL_TABLET | ORAL | Status: DC

## 2015-09-26 ENCOUNTER — Other Ambulatory Visit: Payer: Self-pay | Admitting: *Deleted

## 2015-09-26 ENCOUNTER — Other Ambulatory Visit: Payer: Self-pay | Admitting: Oncology

## 2015-09-26 MED ORDER — PREDNISONE 5 MG PO TABS: ORAL_TABLET | ORAL | Status: DC

## 2015-10-17 ENCOUNTER — Inpatient Hospital Stay: Payer: Medicare Other | Attending: Oncology

## 2015-10-17 DIAGNOSIS — C61 Malignant neoplasm of prostate: Secondary | ICD-10-CM

## 2015-10-17 LAB — PSA: PSA: 0 ng/mL (ref 0.00–4.00)

## 2016-01-16 ENCOUNTER — Inpatient Hospital Stay: Payer: Medicare Other | Attending: Oncology

## 2016-01-16 DIAGNOSIS — C61 Malignant neoplasm of prostate: Secondary | ICD-10-CM | POA: Diagnosis not present

## 2016-01-16 LAB — COMPREHENSIVE METABOLIC PANEL
ALT: 13 U/L — ABNORMAL LOW (ref 17–63)
AST: 15 U/L (ref 15–41)
Albumin: 4.3 g/dL (ref 3.5–5.0)
Alkaline Phosphatase: 65 U/L (ref 38–126)
Anion gap: 7 (ref 5–15)
BUN: 14 mg/dL (ref 6–20)
CO2: 28 mmol/L (ref 22–32)
Calcium: 9.1 mg/dL (ref 8.9–10.3)
Chloride: 101 mmol/L (ref 101–111)
Creatinine, Ser: 0.85 mg/dL (ref 0.61–1.24)
GFR calc Af Amer: >60 mL/min (ref >60)
GFR calc non Af Amer: >60 mL/min (ref >60)
Glucose, Bld: 125 mg/dL — ABNORMAL HIGH (ref 65–99)
Potassium: 4.3 mmol/L (ref 3.5–5.1)
Sodium: 136 mmol/L (ref 135–145)
Total Bilirubin: 1.3 mg/dL — ABNORMAL HIGH (ref 0.3–1.2)
Total Protein: 6.9 g/dL (ref 6.5–8.1)

## 2016-01-16 LAB — CBC WITH DIFFERENTIAL/PLATELET
Basophils Absolute: 0 10*3/uL (ref 0–0.1)
Basophils Relative: 1 %
Eosinophils Absolute: 0.3 10*3/uL (ref 0–0.7)
Eosinophils Relative: 6 %
HCT: 42.3 % (ref 40.0–52.0)
Hemoglobin: 14.7 g/dL (ref 13.0–18.0)
Lymphocytes Relative: 18 %
Lymphs Abs: 0.9 10*3/uL — ABNORMAL LOW (ref 1.0–3.6)
MCH: 30.8 pg (ref 26.0–34.0)
MCHC: 34.8 g/dL (ref 32.0–36.0)
MCV: 88.4 fL (ref 80.0–100.0)
Monocytes Absolute: 0.3 10*3/uL (ref 0.2–1.0)
Monocytes Relative: 6 %
Neutro Abs: 3.6 10*3/uL (ref 1.4–6.5)
Neutrophils Relative %: 69 %
Platelets: 207 10*3/uL (ref 150–440)
RBC: 4.78 MIL/uL (ref 4.40–5.90)
RDW: 14.1 % (ref 11.5–14.5)
WBC: 5.2 10*3/uL (ref 3.8–10.6)

## 2016-01-16 LAB — PSA: PSA: <0.01 ng/mL (ref 0.00–4.00)

## 2016-01-22 NOTE — Progress Notes (Signed)
[place]  [phone] [phone]) [phone]  ID: Brandon Shelton OB: Aug 28, 1945  MR#: [id number]  [id number]  Patient Care Team: Juluis Pitch, MD as PCP - General (Family Medicine)  CHIEF COMPLAINT: Prostate cancer  INTERVAL HISTORY: Patient is 70 year old male with a long-standing history of prostate cancer. He initially had a prostatectomy in September 2005. He was noted to have an increasing PSA 2006 and subsequent underwent XRT. He initiated Lupron for several years after that. Most recently, patient was placed on Zytiga and has been taking this without side effects or progression of disease since 2014. He currently feels well and is asymptomatic. He has no neurologic complaints. He denies any recent fevers or illnesses. He has a good appetite and denies weight loss. He has no chest pain or shortness of breath. He denies any nausea, vomiting, constipation, or diarrhea. He has no urinary complaints. Patient feels at his baseline and offers no specific complaints today.  REVIEW OF SYSTEMS:   Review of Systems  Constitutional: Negative for fever, malaise/fatigue and weight loss.  Respiratory: Negative.  Negative for cough and shortness of breath.   Cardiovascular: Negative.  Negative for chest pain.  Gastrointestinal: Negative.  Negative for abdominal pain, nausea and vomiting.  Genitourinary: Negative.   Musculoskeletal: Negative.   Skin: Negative for rash.  Neurological: Negative.  Negative for weakness.  Psychiatric/Behavioral: Negative.     As per HPI. Otherwise, a complete review of systems is negatve.  PAST MEDICAL HISTORY: No past medical history on file.  PAST SURGICAL HISTORY: Prostatectomy.  FAMILY HISTORY: Reviewed and unchanged. No reported history of malignancy or chronic disease.     ADVANCED DIRECTIVES:    HEALTH MAINTENANCE: Social History  Substance Use Topics  . Smoking status: Former Research scientist (life sciences)  . Smokeless tobacco:  Not on file  . Alcohol use Not on file     Colonoscopy:  PAP:  Bone density:  Lipid panel:  No Known Allergies  Current Outpatient Prescriptions  Medication Sig Dispense Refill  . atorvastatin (LIPITOR) 20 MG tablet TAKE 1/2 TABLET ONCE DAILY    . calcium-vitamin D (SM CALCIUM 500/VITAMIN D3) 500-400 MG-UNIT per tablet Take by mouth.    . predniSONE (DELTASONE) 5 MG tablet TAKE ONE TABLET EVERY TWELVE HOURS 180 tablet 1  . ZYTIGA 250 MG tablet TAKE FOUR TABLETS (1,000 MG) ONCE EVERY DAY. 120 tablet 0   No current facility-administered medications for this visit.     OBJECTIVE: There were no vitals filed for this visit.   There is no height or weight on file to calculate BMI.    ECOG FS:0 - Asymptomatic  General: Well-developed, well-nourished, no acute distress. Eyes: Pink conjunctiva, anicteric sclera. HEENT: Normocephalic, moist mucous membranes, clear oropharnyx. Lungs: Clear to auscultation bilaterally. Heart: Regular rate and rhythm. No rubs, murmurs, or gallops. Abdomen: Soft, nontender, nondistended. No organomegaly noted, normoactive bowel sounds. Musculoskeletal: No edema, cyanosis, or clubbing. Neuro: Alert, answering all questions appropriately. Cranial nerves grossly intact. Skin: No rashes or petechiae noted. Psych: Normal affect. Lymphatics: No cervical, calvicular, axillary or inguinal LAD.   LAB RESULTS:  Lab Results  Component Value Date   NA 136 01/16/2016   K 4.3 01/16/2016   CL 101 01/16/2016   CO2 28 01/16/2016   GLUCOSE 125 (H) 01/16/2016   BUN 14 01/16/2016   CREATININE 0.85 01/16/2016   CALCIUM 9.1 01/16/2016   PROT 6.9 01/16/2016   ALBUMIN 4.3 01/16/2016   AST 15 01/16/2016   ALT 13 (L) 01/16/2016  ALKPHOS 65 01/16/2016   BILITOT 1.3 (H) 01/16/2016   GFRNONAA >60 01/16/2016   GFRAA >60 01/16/2016    Lab Results  Component Value Date   WBC 5.2 01/16/2016   NEUTROABS 3.6 01/16/2016   HGB 14.7 01/16/2016   HCT 42.3 01/16/2016    MCV 88.4 01/16/2016   PLT 207 01/16/2016   Lab Results  Component Value Date   PSA <0.01 01/16/2016     STUDIES: No results found.  ASSESSMENT: Prostate cancer  PLAN:    1. Prostate cancer: Continue Zytiga and 5 mg of prednisone twice per day. Patient's PSA continues to be undetectable. No further intervention is needed. Discussed with patient the possibility of discontinuing treatment, but he is not interested at this time. Patient will require a bone mineral density in the near future given his chronic use of low-dose prednisone. Return to clinic in 3 months for laboratory work and then in 6 months for laboratory work and further evaluation. 2. Elevated bilirubin: Mild, chronic and unchanged.  Approximately 30 minutes was spent in discussion of which greater than 50% was consultation.  Patient expressed understanding and was in agreement with this plan. He also understands that He can call clinic at any time with any questions, concerns, or complaints.   Cancer of prostate [facility])   Staging form: Prostate, AJCC 7th Edition   - Clinical: Stage IV (M1, Gleason <= 6 - Well differentiated (slight anaplasia)) - Signed by Evlyn Kanner, NP on 04/19/2015  [name], MD   01/22/2016 8:44 PM

## 2016-01-23 ENCOUNTER — Encounter: Payer: Self-pay | Admitting: Oncology

## 2016-01-23 ENCOUNTER — Inpatient Hospital Stay: Payer: Medicare Other | Attending: Oncology | Admitting: Oncology

## 2016-01-23 ENCOUNTER — Ambulatory Visit: Payer: Medicare Other | Admitting: Oncology

## 2016-01-23 VITALS — BP 135/78 | HR 62 | Temp 96.2°F | Ht 66.0 in | Wt 159.5 lb

## 2016-01-23 DIAGNOSIS — Z9079 Acquired absence of other genital organ(s): Secondary | ICD-10-CM | POA: Insufficient documentation

## 2016-01-23 DIAGNOSIS — C61 Malignant neoplasm of prostate: Secondary | ICD-10-CM

## 2016-01-23 DIAGNOSIS — Z79899 Other long term (current) drug therapy: Secondary | ICD-10-CM | POA: Diagnosis not present

## 2016-01-23 DIAGNOSIS — Z923 Personal history of irradiation: Secondary | ICD-10-CM | POA: Diagnosis not present

## 2016-01-23 NOTE — Progress Notes (Signed)
No major changes since last visit since last visit with Dr. Oliva Bustard.

## 2016-03-29 ENCOUNTER — Other Ambulatory Visit: Payer: Self-pay | Admitting: Oncology

## 2016-04-24 ENCOUNTER — Inpatient Hospital Stay: Payer: Medicare Other | Attending: Oncology

## 2016-04-24 DIAGNOSIS — C61 Malignant neoplasm of prostate: Secondary | ICD-10-CM

## 2016-04-24 LAB — CBC WITH DIFFERENTIAL/PLATELET
Basophils Absolute: 0 10*3/uL (ref 0–0.1)
Basophils Relative: 1 %
Eosinophils Absolute: 0.2 10*3/uL (ref 0–0.7)
Eosinophils Relative: 6 %
HCT: 39.2 % — ABNORMAL LOW (ref 40.0–52.0)
Hemoglobin: 13.6 g/dL (ref 13.0–18.0)
Lymphocytes Relative: 20 %
Lymphs Abs: 0.9 10*3/uL — ABNORMAL LOW (ref 1.0–3.6)
MCH: 30.9 pg (ref 26.0–34.0)
MCHC: 34.7 g/dL (ref 32.0–36.0)
MCV: 88.9 fL (ref 80.0–100.0)
Monocytes Absolute: 0.4 10*3/uL (ref 0.2–1.0)
Monocytes Relative: 8 %
Neutro Abs: 2.9 10*3/uL (ref 1.4–6.5)
Neutrophils Relative %: 65 %
Platelets: 215 10*3/uL (ref 150–440)
RBC: 4.41 MIL/uL (ref 4.40–5.90)
RDW: 13.7 % (ref 11.5–14.5)
WBC: 4.4 10*3/uL (ref 3.8–10.6)

## 2016-04-24 LAB — COMPREHENSIVE METABOLIC PANEL
ALT: 14 U/L — ABNORMAL LOW (ref 17–63)
AST: 18 U/L (ref 15–41)
Albumin: 4.1 g/dL (ref 3.5–5.0)
Alkaline Phosphatase: 62 U/L (ref 38–126)
Anion gap: 8 (ref 5–15)
BUN: 17 mg/dL (ref 6–20)
CO2: 27 mmol/L (ref 22–32)
Calcium: 9 mg/dL (ref 8.9–10.3)
Chloride: 104 mmol/L (ref 101–111)
Creatinine, Ser: 0.85 mg/dL (ref 0.61–1.24)
GFR calc Af Amer: >60 mL/min (ref >60)
GFR calc non Af Amer: >60 mL/min (ref >60)
Glucose, Bld: 132 mg/dL — ABNORMAL HIGH (ref 65–99)
Potassium: 4.1 mmol/L (ref 3.5–5.1)
Sodium: 139 mmol/L (ref 135–145)
Total Bilirubin: 1.5 mg/dL — ABNORMAL HIGH (ref 0.3–1.2)
Total Protein: 6.5 g/dL (ref 6.5–8.1)

## 2016-04-24 LAB — PSA: PSA: <0.01 ng/mL (ref 0.00–4.00)

## 2016-05-08 ENCOUNTER — Other Ambulatory Visit: Payer: Self-pay | Admitting: *Deleted

## 2016-05-08 DIAGNOSIS — I10 Essential (primary) hypertension: Secondary | ICD-10-CM

## 2016-05-08 DIAGNOSIS — Z13220 Encounter for screening for lipoid disorders: Secondary | ICD-10-CM

## 2016-05-08 DIAGNOSIS — R7302 Impaired glucose tolerance (oral): Secondary | ICD-10-CM

## 2016-06-27 ENCOUNTER — Observation Stay
Admission: EM | Admit: 2016-06-27 | Discharge: 2016-06-28 | Disposition: A | Discharge status: Home / Self Care | Payer: Medicare Other | Attending: Internal Medicine | Admitting: Internal Medicine

## 2016-06-27 ENCOUNTER — Emergency Department: Payer: Medicare Other

## 2016-06-27 DIAGNOSIS — X58XXXA Exposure to other specified factors, initial encounter: Secondary | ICD-10-CM | POA: Insufficient documentation

## 2016-06-27 DIAGNOSIS — E119 Type 2 diabetes mellitus without complications: Secondary | ICD-10-CM | POA: Diagnosis not present

## 2016-06-27 DIAGNOSIS — Z79899 Other long term (current) drug therapy: Secondary | ICD-10-CM | POA: Diagnosis not present

## 2016-06-27 DIAGNOSIS — Z87891 Personal history of nicotine dependence: Secondary | ICD-10-CM | POA: Diagnosis not present

## 2016-06-27 DIAGNOSIS — M8580 Other specified disorders of bone density and structure, unspecified site: Secondary | ICD-10-CM | POA: Insufficient documentation

## 2016-06-27 DIAGNOSIS — I471 Supraventricular tachycardia, unspecified: Secondary | ICD-10-CM | POA: Diagnosis present

## 2016-06-27 DIAGNOSIS — C61 Malignant neoplasm of prostate: Secondary | ICD-10-CM | POA: Diagnosis not present

## 2016-06-27 DIAGNOSIS — R7989 Other specified abnormal findings of blood chemistry: Secondary | ICD-10-CM

## 2016-06-27 DIAGNOSIS — I248 Other forms of acute ischemic heart disease: Secondary | ICD-10-CM | POA: Insufficient documentation

## 2016-06-27 DIAGNOSIS — Z7952 Long term (current) use of systemic steroids: Secondary | ICD-10-CM | POA: Diagnosis not present

## 2016-06-27 DIAGNOSIS — Z7951 Long term (current) use of inhaled steroids: Secondary | ICD-10-CM | POA: Diagnosis not present

## 2016-06-27 DIAGNOSIS — T7840XA Allergy, unspecified, initial encounter: Secondary | ICD-10-CM | POA: Insufficient documentation

## 2016-06-27 DIAGNOSIS — I10 Essential (primary) hypertension: Secondary | ICD-10-CM | POA: Diagnosis not present

## 2016-06-27 DIAGNOSIS — R778 Other specified abnormalities of plasma proteins: Secondary | ICD-10-CM

## 2016-06-27 DIAGNOSIS — E7800 Pure hypercholesterolemia, unspecified: Secondary | ICD-10-CM | POA: Insufficient documentation

## 2016-06-27 LAB — CBC
HCT: 43.9 % (ref 40.0–52.0)
Hemoglobin: 14.8 g/dL (ref 13.0–18.0)
MCH: 29.9 pg (ref 26.0–34.0)
MCHC: 33.7 g/dL (ref 32.0–36.0)
MCV: 88.8 fL (ref 80.0–100.0)
Platelets: 303 10*3/uL (ref 150–440)
RBC: 4.94 MIL/uL (ref 4.40–5.90)
RDW: 13.4 % (ref 11.5–14.5)
WBC: 8 10*3/uL (ref 3.8–10.6)

## 2016-06-27 LAB — BASIC METABOLIC PANEL
Anion gap: 10 (ref 5–15)
BUN: 19 mg/dL (ref 6–20)
CO2: 24 mmol/L (ref 22–32)
Calcium: 9.4 mg/dL (ref 8.9–10.3)
Chloride: 102 mmol/L (ref 101–111)
Creatinine, Ser: 1.06 mg/dL (ref 0.61–1.24)
GFR calc Af Amer: >60 mL/min (ref >60)
GFR calc non Af Amer: >60 mL/min (ref >60)
Glucose, Bld: 143 mg/dL — ABNORMAL HIGH (ref 65–99)
Potassium: 3.6 mmol/L (ref 3.5–5.1)
Sodium: 136 mmol/L (ref 135–145)

## 2016-06-27 LAB — TROPONIN I
Troponin I: <0.03 ng/mL (ref <0.03)
Troponin I: 0.06 ng/mL (ref <0.03)
Troponin I: 0.11 ng/mL (ref <0.03)

## 2016-06-27 MED ORDER — ASPIRIN 81 MG PO CHEW
324.0000 mg | CHEWABLE_TABLET | Freq: Once | ORAL | Status: AC
  Administered 2016-06-27: 324 mg via ORAL
  Filled 2016-06-27: qty 4

## 2016-06-27 NOTE — ED Triage Notes (Signed)
Pt arrived from home via EMS for reports of palpitations. Pt denies chest pain but had a brief episode of shortness of breath. Pt denies pain and SOB on arrival. EMS administered Cardizem 20 mg. EMS reports VSS.

## 2016-06-27 NOTE — ED Notes (Signed)
Updated patient on POC explaining that we will redraw some blood work at 17:30.

## 2016-06-27 NOTE — ED Notes (Signed)
ED Provider at bedside. 

## 2016-06-27 NOTE — ED Provider Notes (Signed)
[facility] Emergency Department Provider Note   ____________________________________________    I have reviewed the triage vital signs and the nursing notes.   HISTORY  Chief Complaint Palpitations     HPI Brandon Shelton is a 71 y.o. male who presents with complaints of palpitations. Patient reports he was at home when he felt his heart start to race. Patient reports he has had this several times in the past, most recently just prior to Thanksgiving. He has seen his cardiologist about it but has thus far refused medications. Tried vagal maneuvers with no improvement. Eventually he started to feel clammy and decided to call EMS. EMS gave Cardizem en route and now he feels quite well. No chest pain reported to me. No calf pain or swelling  Past Medical History:  Diagnosis Date  . High cholesterol   . Prostate cancer [facility])     Patient Active Problem List   Diagnosis Date Noted  . Cancer of prostate ([place]) 01/18/2015  . Diabetes mellitus, type 2 ([place]) 01/31/2014  . H/O malignant neoplasm of prostate 01/31/2014  . HLD (hyperlipidemia) 01/31/2014  . BP (high blood pressure) 01/31/2014  . Osteopenia 01/31/2014  . Type 2 diabetes mellitus ([place]) 01/31/2014    No past surgical history on file.  Prior to Admission medications   Medication Sig Start Date End Date Taking? Authorizing Provider  atorvastatin (LIPITOR) 20 MG tablet TAKE 1/2 TABLET ONCE DAILY 04/26/14   Historical Provider, MD  calcium-vitamin D (SM CALCIUM 500/VITAMIN D3) 500-400 MG-UNIT per tablet Take by mouth.    Historical Provider, MD  predniSONE (DELTASONE) 5 MG tablet TAKE 1 TABLET BY MOUTH  EVERY 12 HOURS 04/01/16   [name], MD  ZYTIGA 250 MG tablet TAKE FOUR TABLETS (1,000 MG) ONCE EVERY DAY. 09/26/15   Evlyn Kanner, NP     Allergies Patient has no known allergies.  No family history on file.  Social History Social History  Substance Use Topics  . Smoking  status: Former Smoker    Types: Cigarettes    Quit date: 06/24/1972  . Smokeless tobacco: Never Used  . Alcohol use No    Review of Systems  Constitutional: No fever/chills  Cardiovascular: Denies chest pain.Palpitations as above  Gastrointestinal: No abdominal pain.  No nausea, no vomiting.    Musculoskeletal: Negative for back pain. Skin: Left calf rash Neurological: Negative for headaches or weakness  10-point ROS otherwise negative.  ____________________________________________   PHYSICAL EXAM:  VITAL SIGNS: ED Triage Vitals  Enc Vitals Group     BP 06/27/16 1531 129/82     Pulse Rate 06/27/16 1531 97     Resp 06/27/16 1531 18     Temp 06/27/16 1531 97.1 F (36.2 C)     Temp Source 06/27/16 1531 Oral     SpO2 06/27/16 1531 100 %     Weight 06/27/16 1532 155 lb (70.3 kg)     Height 06/27/16 1532 5\' 6"  (1.676 m)     Head Circumference --      Peak Flow --      Pain Score --      Pain Loc --      Pain Edu? --      Excl. in [place]? --     Constitutional: Alert and oriented. No acute distress. Pleasant and interactive Eyes: Conjunctivae are normal.   Nose: No congestion/rhinnorhea. Mouth/Throat: Mucous membranes are moist.    Cardiovascular: Normal rate, regular rhythm. Grossly normal heart  sounds.  Good peripheral circulation. Respiratory: Normal respiratory effort.  No retractions. Lungs CTAB.  Genitourinary: deferred Musculoskeletal: No lower extremity tenderness nor edema.  Warm and well perfused Neurologic:  Normal speech and language. No gross focal neurologic deficits are appreciated.  Skin:  Skin is warm, dry and intact.  Psychiatric: Mood and affect are normal. Speech and behavior are normal.  ____________________________________________   LABS (all labs ordered are listed, but only abnormal results are displayed)  Labs Reviewed  BASIC METABOLIC PANEL - Abnormal; Notable for the following:       Result Value   Glucose, Bld 143 (*)    All other  components within normal limits  CBC  TROPONIN I  TROPONIN I   ____________________________________________  EKG  ED ECG REPORT I, Lavonia Drafts, the attending physician, personally viewed and interpreted this ECG.  Date: 06/27/2016 EKG Time: 3:31 PM Rate: 76 Rhythm: normal sinus rhythm QRS Axis: normal Intervals: normal ST/T Wave abnormalities: normal Conduction Disturbances: none Narrative Interpretation: unremarkable  ____________________________________________  RADIOLOGY  Chest x-ray normal ____________________________________________   PROCEDURES  Procedure(s) performed: No    Critical Care performed: No ____________________________________________   INITIAL IMPRESSION / ASSESSMENT AND PLAN / ED COURSE  Pertinent labs & imaging results that were available during my care of the patient were reviewed by me and considered in my medical decision making (see chart for details).  Patient presents with a history of PSVT. His EMS EKG is consistent with SVT, his EKG here is completely normal. The patient looks extremely well and is quite comfortable. His labs are unremarkable. We will check a second troponin to rule out any cardiac strain.  Clinical Course   ----------------------------------------- 6:28 PM on 06/27/2016 ----------------------------------------- Discussed with Dr. Saralyn Pilar given mildly elevated troponin, he recommends two-hour repeat check and if no significant increase okay for discharge ----------------------------------------- 8:55 PM on 06/27/2016 -----------------------------------------  Troponin has increased to 0.11, given this I will admit for further evaluation by hospitalist and cardiology. ____________________________________________   FINAL CLINICAL IMPRESSION(S) / ED DIAGNOSES  Final diagnoses:  SVT (supraventricular tachycardia) (HCC)  Elevated troponin      NEW MEDICATIONS STARTED DURING THIS VISIT:  New  Prescriptions   No medications on file     Note:  This document was prepared using Dragon voice recognition software and may include unintentional dictation errors.    Lavonia Drafts, MD 06/27/16 2055

## 2016-06-27 NOTE — ED Notes (Signed)
Patient given a meal tray.

## 2016-06-28 DIAGNOSIS — I471 Supraventricular tachycardia: Secondary | ICD-10-CM | POA: Diagnosis not present

## 2016-06-28 LAB — CBC
HCT: 40.9 % (ref 40.0–52.0)
Hemoglobin: 14.2 g/dL (ref 13.0–18.0)
MCH: 30.8 pg (ref 26.0–34.0)
MCHC: 34.7 g/dL (ref 32.0–36.0)
MCV: 88.9 fL (ref 80.0–100.0)
Platelets: 308 10*3/uL (ref 150–440)
RBC: 4.6 MIL/uL (ref 4.40–5.90)
RDW: 13.8 % (ref 11.5–14.5)
WBC: 6.3 10*3/uL (ref 3.8–10.6)

## 2016-06-28 LAB — BASIC METABOLIC PANEL
Anion gap: 6 (ref 5–15)
BUN: 21 mg/dL — ABNORMAL HIGH (ref 6–20)
CO2: 28 mmol/L (ref 22–32)
Calcium: 8.5 mg/dL — ABNORMAL LOW (ref 8.9–10.3)
Chloride: 104 mmol/L (ref 101–111)
Creatinine, Ser: 0.91 mg/dL (ref 0.61–1.24)
GFR calc Af Amer: >60 mL/min (ref >60)
GFR calc non Af Amer: >60 mL/min (ref >60)
Glucose, Bld: 102 mg/dL — ABNORMAL HIGH (ref 65–99)
Potassium: 3.5 mmol/L (ref 3.5–5.1)
Sodium: 138 mmol/L (ref 135–145)

## 2016-06-28 LAB — GLUCOSE, CAPILLARY
Glucose-Capillary: 101 mg/dL — ABNORMAL HIGH (ref 65–99)
Glucose-Capillary: 103 mg/dL — ABNORMAL HIGH (ref 65–99)
Glucose-Capillary: 116 mg/dL — ABNORMAL HIGH (ref 65–99)

## 2016-06-28 LAB — TROPONIN I
Troponin I: 0.12 ng/mL (ref <0.03)
Troponin I: 0.09 ng/mL (ref <0.03)

## 2016-06-28 LAB — PHOSPHORUS: Phosphorus: 4.3 mg/dL (ref 2.5–4.6)

## 2016-06-28 LAB — TSH: TSH: 4.002 u[IU]/mL (ref 0.350–4.500)

## 2016-06-28 LAB — MAGNESIUM: Magnesium: 2.4 mg/dL (ref 1.7–2.4)

## 2016-06-28 MED ORDER — ACETAMINOPHEN 650 MG RE SUPP: 650.0000 mg | Freq: Four times a day (QID) | RECTAL | Status: DC | PRN

## 2016-06-28 MED ORDER — CALCIUM CARBONATE-VITAMIN D 500-200 MG-UNIT PO TABS
1.0000 | ORAL_TABLET | Freq: Every day | ORAL | Status: DC
  Filled 2016-06-28: qty 1

## 2016-06-28 MED ORDER — DILTIAZEM HCL ER COATED BEADS 120 MG PO CP24: 120.0000 mg | ORAL_CAPSULE | Freq: Every day | ORAL | 0 refills | Status: DC

## 2016-06-28 MED ORDER — INSULIN ASPART 100 UNIT/ML ~~LOC~~ SOLN: 0.0000 [IU] | Freq: Three times a day (TID) | SUBCUTANEOUS | Status: DC

## 2016-06-28 MED ORDER — ENOXAPARIN SODIUM 40 MG/0.4ML ~~LOC~~ SOLN
40.0000 mg | Freq: Every day | SUBCUTANEOUS | Status: DC
  Administered 2016-06-28: 40 mg via SUBCUTANEOUS
  Filled 2016-06-28: qty 0.4

## 2016-06-28 MED ORDER — FLUTICASONE PROPIONATE 50 MCG/ACT NA SUSP
1.0000 | Freq: Two times a day (BID) | NASAL | Status: DC
  Filled 2016-06-28: qty 16

## 2016-06-28 MED ORDER — SODIUM CHLORIDE 0.9 % IV SOLN
INTRAVENOUS | Status: DC
  Administered 2016-06-28: 02:00:00 via INTRAVENOUS

## 2016-06-28 MED ORDER — ONDANSETRON HCL 4 MG PO TABS: 4.0000 mg | ORAL_TABLET | Freq: Four times a day (QID) | ORAL | Status: DC | PRN

## 2016-06-28 MED ORDER — DILTIAZEM HCL ER COATED BEADS 120 MG PO CP24
120.0000 mg | ORAL_CAPSULE | Freq: Every day | ORAL | Status: DC
  Administered 2016-06-28: 120 mg via ORAL
  Filled 2016-06-28: qty 1

## 2016-06-28 MED ORDER — ONDANSETRON HCL 4 MG/2ML IJ SOLN: 4.0000 mg | Freq: Four times a day (QID) | INTRAMUSCULAR | Status: DC | PRN

## 2016-06-28 MED ORDER — ABIRATERONE ACETATE 250 MG PO TABS
1000.0000 mg | ORAL_TABLET | Freq: Every day | ORAL | Status: DC
  Filled 2016-06-28: qty 4

## 2016-06-28 MED ORDER — ACETAMINOPHEN 325 MG PO TABS: 650.0000 mg | ORAL_TABLET | Freq: Four times a day (QID) | ORAL | Status: DC | PRN

## 2016-06-28 MED ORDER — SODIUM CHLORIDE 0.9% FLUSH
3.0000 mL | Freq: Two times a day (BID) | INTRAVENOUS | Status: DC
  Administered 2016-06-28: 3 mL via INTRAVENOUS

## 2016-06-28 MED ORDER — OXYCODONE HCL 5 MG PO TABS
5.0000 mg | ORAL_TABLET | ORAL | Status: DC | PRN
Start: 2016-06-28 — End: 2016-06-28

## 2016-06-28 MED ORDER — MAGNESIUM CITRATE PO SOLN: 1.0000 | Freq: Once | ORAL | Status: DC | PRN

## 2016-06-28 MED ORDER — ATORVASTATIN CALCIUM 10 MG PO TABS
10.0000 mg | ORAL_TABLET | Freq: Every day | ORAL | Status: DC
  Filled 2016-06-28: qty 1

## 2016-06-28 MED ORDER — CARVEDILOL 3.125 MG PO TABS: 3.1250 mg | ORAL_TABLET | Freq: Two times a day (BID) | ORAL | Status: DC

## 2016-06-28 MED ORDER — INSULIN ASPART 100 UNIT/ML ~~LOC~~ SOLN: 0.0000 [IU] | Freq: Every day | SUBCUTANEOUS | Status: DC

## 2016-06-28 MED ORDER — SENNOSIDES-DOCUSATE SODIUM 8.6-50 MG PO TABS: 1.0000 | ORAL_TABLET | Freq: Every evening | ORAL | Status: DC | PRN

## 2016-06-28 MED ORDER — ENOXAPARIN SODIUM 30 MG/0.3ML ~~LOC~~ SOLN
30.0000 mg | Freq: Once | SUBCUTANEOUS | Status: AC
  Administered 2016-06-28: 30 mg via SUBCUTANEOUS
  Filled 2016-06-28: qty 0.3

## 2016-06-28 MED ORDER — BENZONATATE 100 MG PO CAPS: 100.0000 mg | ORAL_CAPSULE | Freq: Three times a day (TID) | ORAL | Status: DC | PRN

## 2016-06-28 MED ORDER — BISACODYL 5 MG PO TBEC: 5.0000 mg | DELAYED_RELEASE_TABLET | Freq: Every day | ORAL | Status: DC | PRN

## 2016-06-28 MED ORDER — PREDNISONE 2.5 MG PO TABS: 5.0000 mg | ORAL_TABLET | Freq: Two times a day (BID) | ORAL | Status: DC

## 2016-06-28 MED ORDER — ALBUTEROL SULFATE (2.5 MG/3ML) 0.083% IN NEBU: 2.5000 mg | INHALATION_SOLUTION | Freq: Four times a day (QID) | RESPIRATORY_TRACT | Status: DC | PRN

## 2016-06-28 NOTE — Progress Notes (Signed)
Patient ambulated around the unit with nurse tech three times. Patient's HR remained in the 80s.  No chest pain or palpitations noted. Will continue to monitor.

## 2016-06-28 NOTE — Consult Note (Signed)
[facility] Cardiology  CARDIOLOGY CONSULT NOTE  Patient ID: Brandon Shelton MRN: [id number] DOB/AGE: 1946/04/10 71 y.o.  Admit date: 06/27/2016 Referring Physician patel Primary Physician [place] Primary Cardiologist Nehemiah Massed Reason for Consultation SVT  HPI: 71 year old male referred for evaluation of SVT. Patient reports a history of paroxysmal SVT. Typically has 3-4 episodes per year routinely broken by Valsalva maneuver. The patient was in his usual state of health yesterday, when he developed tachycardia, which did not resolve after Valsalva maneuver, called EMS, noted to be in VT, is given Cardizem bolus in route, and converted to sinus rhythm. In the emergency room, the patient is symptomatic, denied chest pain or shortness of breath. Lab work, revealed borderline elevated troponin of 0.06. Follow-up troponins were 0.11 and 0.12 without concomitant chest pain. This morning, patient reports doing well, denies chest pain or shortness of breath. He typically is very active, 3 miles a day, routinely plays pickle ball without chest pain.  Review of systems complete and found to be negative unless listed above     Past Medical History:  Diagnosis Date  . High cholesterol   . Prostate cancer [facility])     History reviewed. No pertinent surgical history.  Prescriptions Prior to Admission  Medication Sig Dispense Refill Last Dose  . atorvastatin (LIPITOR) 20 MG tablet TAKE 1/2 TABLET ONCE DAILY   06/26/2016 at 2000  . benzonatate (TESSALON) 100 MG capsule Take 100 mg by mouth 3 (three) times daily as needed for cough.   PRN at PRN  . calcium-vitamin D (SM CALCIUM 500/VITAMIN D3) 500-400 MG-UNIT per tablet Take by mouth.   06/27/2016 at 0800  . predniSONE (DELTASONE) 5 MG tablet TAKE 1 TABLET BY MOUTH  EVERY 12 HOURS 180 tablet 1 06/27/2016 at 0800  . ZYTIGA 250 MG tablet TAKE FOUR TABLETS (1,000 MG) ONCE EVERY DAY. 120 tablet 0 06/27/2016 at 0300  . fluticasone (FLONASE) 50 MCG/ACT nasal spray Place 1  spray into both nostrils 2 (two) times daily.   Not Taking at PRN   Social History   Social History  . Marital status: Married    Spouse name: N/A  . Number of children: N/A  . Years of education: N/A   Occupational History  . Not on file.   Social History Main Topics  . Smoking status: Former Smoker    Types: Cigarettes    Quit date: 06/24/1972  . Smokeless tobacco: Never Used  . Alcohol use No  . Drug use: No  . Sexual activity: Not on file   Other Topics Concern  . Not on file   Social History Narrative  . No narrative on file    History reviewed. No pertinent family history.    Review of systems complete and found to be negative unless listed above      PHYSICAL EXAM  General: Well developed, well nourished, in no acute distress HEENT:  Normocephalic and atramatic Neck:  No JVD.  Lungs: Clear bilaterally to auscultation and percussion. Heart: HRRR . Normal S1 and S2 without gallops or murmurs.  Abdomen: Bowel sounds are positive, abdomen soft and non-tender  Msk:  Back normal, normal gait. Normal strength and tone for age. Extremities: No clubbing, cyanosis or edema.   Neuro: Alert and oriented X 3. Psych:  Good affect, responds appropriately  Labs:   Lab Results  Component Value Date   WBC 6.3 06/28/2016   HGB 14.2 06/28/2016   HCT 40.9 06/28/2016   MCV 88.9 06/28/2016   PLT 308  06/28/2016    Recent Labs Lab 06/28/16 0252  NA 138  K 3.5  CL 104  CO2 28  BUN 21*  CREATININE 0.91  CALCIUM 8.5*  GLUCOSE 102*   Lab Results  Component Value Date   TROPONINI 0.12 ([place]) 06/28/2016   No results found for: CHOL No results found for: HDL No results found for: LDLCALC No results found for: TRIG No results found for: CHOLHDL No results found for: LDLDIRECT    Radiology: Dg Chest 2 View  Result Date: 06/27/2016 CLINICAL DATA:  Cardiac palpitations EXAM: CHEST  2 VIEW COMPARISON:  None. FINDINGS: The heart size and mediastinal contours are within  normal limits. Both lungs are clear. The visualized skeletal structures are unremarkable. IMPRESSION: No active cardiopulmonary disease. Electronically Signed   By: Inez Catalina M.D.   On: 06/27/2016 16:22    EKG: Normal sinus rhythm  ASSESSMENT AND PLAN:   1. Paroxysmal SVT, converted to sinus rhythm with Cardizem bolus, with borderline elevated troponin, likely demand supply ischemia, and not due to acute coronary syndrome  Recommendations  1. DC Lovenox 2. Start maintenance Cardizem CD 120 mg daily 3. Defer cardiac catheterization or additional cardiac diagnostics at this time 4. Ambulate patient, if does well, may discharge later today  Signed: Isaias Cowman MD,PhD, [facility] 06/28/2016, 8:21 AM

## 2016-06-28 NOTE — H&P (Addendum)
History and Physical   [facility] - [place] @ [facility] Admission History and Physical [employer], D.O.    Patient Name: Brandon Shelton MR#: [id number] Date of Birth: 1946-01-09 Date of Admission: 06/27/2016  Referring MD/NP/PA: Dr. Corky Downs Primary Care Physician: Juluis Pitch, MD Outpatient Specialists: Dr. Nehemiah Massed  Patient coming from: Home  Chief Complaint: Palpitations  HPI: Brandon Shelton is a 71 y.o. male with a known history of prostate cancer, diabetes, hypertension, hyperlipidemia and SVT in the past presents to the emergency department for evaluation of palpitations.  Patient was in a usual state of health until this evening when he went to answer the phone and he reported the sudden onset of palpitations without associated chest pain. Patient states that he has had many episodes of SVT in the past, has had some cardiac workup including EKG and a 24-hour Holter monitor following his last episode of SVT but has not had any testing such as echocardiogram or stress test recently. He states that usually her symptoms resolve with vagal maneuvers or spontaneously however he sought medical attention night because his symptoms persisted and were associated with diaphoresis. He denies chest pain, shortness of breath, nausea, vomiting. He states that his symptoms resolved in the ambulance after receiving Cardizem. He states that he has not been prescribed medication for his SVT in the past because his episodes are intermittent and variable. He cannot identify a trigger. He denies any recent stress, change in medications or diet.   Otherwise there has been no change in status. Patient has been taking medication as prescribed and there has been no recent change in medication or diet.  There has been no recent illness, travel or sick contacts.    Patient denies fevers/chills, weakness, dizziness, chest pain, shortness of breath, N/V/C/D, abdominal pain, dysuria/frequency, changes  in mental status.   In the emergency department Dr. Corky Downs spoke with the cardiologist who recommended admission secondary to increasing troponins. Patient did receive 324 mg of aspirin  Review of Systems:  CONSTITUTIONAL: No fever/chills, fatigue, weakness, weight gain/loss, headache. EYES: No blurry or double vision. ENT: No tinnitus, postnasal drip, redness or soreness of the oropharynx. RESPIRATORY: No cough, dyspnea, wheeze, hemoptysis.  CARDIOVASCULAR: No chest pain, palpitations, syncope, orthopnea,  GASTROINTESTINAL: No nausea, vomiting, abdominal pain, constipation, diarrhea.  No hematemesis, melena or hematochezia. GENITOURINARY: No dysuria, frequency, hematuria. ENDOCRINE: No polyuria or nocturia. No heat or cold intolerance. HEMATOLOGY: No anemia, bruising, bleeding. INTEGUMENTARY: No rashes, ulcers, lesions. MUSCULOSKELETAL: No arthritis, gout, dyspnea.  NEUROLOGIC: No numbness, tingling, ataxia, seizure-type activity, weakness. PSYCHIATRIC: No anxiety, depression, insomnia.   Past Medical History:  Diagnosis Date  . High cholesterol   . Prostate cancer ([place])     No past surgical history on file.   reports that he quit smoking about 44 years ago. His smoking use included Cigarettes. He has never used smokeless tobacco. He reports that he does not drink alcohol or use drugs.  No Known Allergies  Patient is not aware of his family history as he was adopted at 63 weeks of age. Family history has been reviewed and confirmed with patient.   Prior to Admission medications   Medication Sig Start Date End Date Taking? Authorizing Provider  atorvastatin (LIPITOR) 20 MG tablet TAKE 1/2 TABLET ONCE DAILY 04/26/14  Yes Historical Provider, MD  benzonatate (TESSALON) 100 MG capsule Take 100 mg by mouth 3 (three) times daily as needed for cough. 06/19/16 06/29/16 Yes Historical Provider, MD  calcium-vitamin D (SM CALCIUM 500/VITAMIN  D3) 500-400 MG-UNIT per tablet Take by mouth.    Yes Historical Provider, MD  predniSONE (DELTASONE) 5 MG tablet TAKE 1 TABLET BY MOUTH  EVERY 12 HOURS 04/01/16  Yes [name], MD  ZYTIGA 250 MG tablet TAKE FOUR TABLETS (1,000 MG) ONCE EVERY DAY. 09/26/15  Yes Evlyn Kanner, NP  fluticasone (FLONASE) 50 MCG/ACT nasal spray Place 1 spray into both nostrils 2 (two) times daily. 06/19/16   Historical Provider, MD    Physical Exam: Vitals:   06/27/16 2224 06/28/16 0034 06/28/16 0100 06/28/16 0151  BP: 119/65 122/71 114/72 (!) 146/69  Pulse: 65 70 (!) 57 (!) 59  Resp: 14 (!) 23 12 18   Temp:    97.5 F (36.4 C)  TempSrc:    Oral  SpO2: 97% 99% 98% 99%  Weight:    69.5 kg (153 lb 4.8 oz)  Height:    5\' 6"  (1.676 m)    GENERAL: 71 y.o.-year-old White male patient, well-developed, well-nourished lying in the bed in no acute distress.  Pleasant and cooperative.   HEENT: Head atraumatic, normocephalic. Pupils equal, round, reactive to light and accommodation. No scleral icterus. Extraocular muscles intact. Nares are patent. Oropharynx is clear. Mucus membranes moist. NECK: Supple, full range of motion. No JVD, no bruit heard. No thyroid enlargement, no tenderness, no cervical lymphadenopathy. CHEST: Normal breath sounds bilaterally. No wheezing, rales, rhonchi or crackles. No use of accessory muscles of respiration.  No reproducible chest wall tenderness.  CARDIOVASCULAR: S1, S2 normal. No murmurs, rubs, or gallops. Cap refill <2 seconds. Pulses intact distally.  ABDOMEN: Soft, nondistended, nontender, . No rebound, guarding, rigidity. Normoactive bowel sounds present in all four quadrants. No organomegaly or mass. EXTREMITIES: No pedal edema, cyanosis, or clubbing. NEUROLOGIC: Cranial nerves II through XII are grossly intact with no focal sensorimotor deficit. Muscle strength 5/5 in all extremities. Sensation intact. Gait not checked. PSYCHIATRIC: The patient is alert and oriented x 3. Normal affect, mood, thought content. SKIN: Warm,  dry, and intact without obvious rash, lesion, or ulcer.   Labs on Admission: I have personally reviewed following labs and imaging studies  CBC:  Recent Labs Lab 06/27/16 1538  WBC 8.0  HGB 14.8  HCT 43.9  MCV 88.8  PLT [id number]   Basic Metabolic Panel:  Recent Labs Lab 06/27/16 1538  NA 136  K 3.6  CL 102  CO2 24  GLUCOSE 143*  BUN 19  CREATININE 1.06  CALCIUM 9.4   GFR: Estimated Creatinine Clearance: 58.5 mL/min (by C-G formula based on SCr of 1.06 mg/dL). Liver Function Tests: No results for input(s): AST, ALT, ALKPHOS, BILITOT, PROT, ALBUMIN in the last 168 hours. No results for input(s): LIPASE, AMYLASE in the last 168 hours. No results for input(s): AMMONIA in the last 168 hours. Coagulation Profile: No results for input(s): INR, PROTIME in the last 168 hours. Cardiac Enzymes:  Recent Labs Lab 06/27/16 1538 06/27/16 1732 06/27/16 1939  TROPONINI <0.03 0.06* 0.11*   BNP (last 3 results) No results for input(s): PROBNP in the last 8760 hours. HbA1C: No results for input(s): HGBA1C in the last 72 hours. CBG:  Recent Labs Lab 06/28/16 0152  GLUCAP 101*   Lipid Profile: No results for input(s): CHOL, HDL, LDLCALC, TRIG, CHOLHDL, LDLDIRECT in the last 72 hours. Thyroid Function Tests: No results for input(s): TSH, T4TOTAL, FREET4, T3FREE, THYROIDAB in the last 72 hours. Anemia Panel: No results for input(s): VITAMINB12, FOLATE, FERRITIN, TIBC, IRON, RETICCTPCT in the last 72 hours. Urine  analysis: No results found for: COLORURINE, APPEARANCEUR, LABSPEC, PHURINE, GLUCOSEU, HGBUR, BILIRUBINUR, KETONESUR, PROTEINUR, UROBILINOGEN, NITRITE, LEUKOCYTESUR Sepsis Labs: @LABRCNTIP (procalcitonin:4,lacticidven:4) )No results found for this or any previous visit (from the past 240 hour(s)).   Radiological Exams on Admission: Dg Chest 2 View  Result Date: 06/27/2016 CLINICAL DATA:  Cardiac palpitations EXAM: CHEST  2 VIEW COMPARISON:  None. FINDINGS: The heart  size and mediastinal contours are within normal limits. Both lungs are clear. The visualized skeletal structures are unremarkable. IMPRESSION: No active cardiopulmonary disease. Electronically Signed   By: Inez Catalina M.D.   On: 06/27/2016 16:22    EKG: Normal sinus rhythm at 76 bpm with normal axis and nonspecific ST-T wave changes.   Assessment/Plan Active Problems:   SVT (supraventricular tachycardia) (HCC)    This is a 71 y.o. male with a history of prostate cancer, diabetes, hypertension, hyperlipidemia and SVT now being admitted with: 1. Elevated troponin likely secondary to paroxysmal SVT-admit to observation with telemetry monitoring. Symptoms have resolved and patient is now asymptomatic. Trend troponins, check TSH, magnesium and phosphorus, gentle IV fluid hydration. Given recurrent symptoms we'll start Coreg low dose twice daily with meals. Consider additional outpatient testing including echocardiogram and stress test. Cardiology consultation has been requested. 2. History of hyperlipidemia-continue Lipitor 3. History of allergies-continue Flonase 4. History of hypertension, not on medication-start Coreg 5. History of prostate cancer-continue Zytiga and prednisone. Patient may take his own. 6. History of diabetes-cover with regular insulin sliding scale coverage.  Admission status: Telemetry, observation3 IV Fluids: IV normal saline Diet/Nutrition: Heart healthy Consults called: Cardiology DVT Px: Lovenox, SCDs and early ambulation Code Status: Full Code  Disposition Plan: To home in less than 24 hours   All the records are reviewed and case discussed with ED provider. Management plans discussed with the patient and/or family who express understanding and agree with plan of care.  Geramy Lamorte D.O. on 06/28/2016 at 2:12 AM Between 7am to 6pm - Pager - [phone] After 6pm go to [email or web address] - Proofreader [facility] Office  [phone] CC: Primary care physician; Juluis Pitch, MD  Harvie Bridge MD [facility] Pager [phone]   If 7PM-7AM, please contact night-coverage [email or web address] Password [email or web address]  06/28/2016, 2:12 AM

## 2016-06-28 NOTE — Care Management Obs Status (Signed)
[facility] NOTIFICATION   Patient Details  Name: Brandon Shelton MRN: [id number] Date of Birth: 22-Jan-1946   Medicare Observation Status Notification Given:  Yes    Katrina Stack, RN 06/28/2016, 6:16 PM

## 2016-06-28 NOTE — Progress Notes (Signed)
MD Pyreddy made aware of new troponin level of .12  Per MD give pt Lovenox 1 mg per kg. Consulted with pharmacy, per pharmacy, its okay to give 30 mg of Lovenox at this time.

## 2016-06-28 NOTE — Progress Notes (Signed)
Patient discharged per MD order and hospital protocol. Patient verbalized understanding of medications, discharge instructions and follow up appointments. Patient escorted out via wheelchair.

## 2016-06-28 NOTE — Discharge Summary (Signed)
[facility] - [place] at [facility], 71 y.o., DOB July 02, 1945, MRN [id number]. Admission date: 06/27/2016 Discharge Date 06/28/2016 Primary MD DAVID Lovie Macadamia, MD Admitting Physician Harvie Bridge, DO  Admission Diagnosis  SVT (supraventricular tachycardia) ([place]) [I47.1] Elevated troponin [R74.8]  Discharge Diagnosis   Active Problems:   SVT (supraventricular tachycardia) (HCC)   Elevate troponin due to demand ischemia   Hyperlipidemia   Prostate cancer           Hospital Course patient 71 year old with known history of prostate cancer diabetes essential hypertension and SVT in the past presented to the emergency room with evaluation for palpitations. Patient was noted to have SVT. He was admitted for further evaluation was seen by cardiologist who recommended starting him on Cardizem CD and outpatient follow-up. Patient was noted to have elevated troponin which was felt to be due to demand ischemia. Currently he is feeling well and denies any complaints.             Consults  cardiology  Significant Tests:  See full reports for all details     Dg Chest 2 View  Result Date: 06/27/2016 CLINICAL DATA:  Cardiac palpitations EXAM: CHEST  2 VIEW COMPARISON:  None. FINDINGS: The heart size and mediastinal contours are within normal limits. Both lungs are clear. The visualized skeletal structures are unremarkable. IMPRESSION: No active cardiopulmonary disease. Electronically Signed   By: Inez Catalina M.D.   On: 06/27/2016 16:22       Today   Subjective:   Brandon Shelton  patient feels well denies any chest pain or shortness of breath  Objective:   Blood pressure 118/70, pulse 66, temperature 98 F (36.7 C), temperature source Oral, resp. rate 18, height 5\' 6"  (1.676 m), weight 153 lb 4.8 oz (69.5 kg), SpO2 97 %.  .  Intake/Output Summary (Last 24 hours) at 06/28/16 1657 Last data filed at 06/28/16 1359  Gross per 24 hour  Intake            681.25 ml  Output              800 ml  Net          -118.75 ml    Exam VITAL SIGNS: Blood pressure 118/70, pulse 66, temperature 98 F (36.7 C), temperature source Oral, resp. rate 18, height 5\' 6"  (1.676 m), weight 153 lb 4.8 oz (69.5 kg), SpO2 97 %.  GENERAL:  71 y.o.-year-old patient lying in the bed with no acute distress.  EYES: Pupils equal, round, reactive to light and accommodation. No scleral icterus. Extraocular muscles intact.  HEENT: Head atraumatic, normocephalic. Oropharynx and nasopharynx clear.  NECK:  Supple, no jugular venous distention. No thyroid enlargement, no tenderness.  LUNGS: Normal breath sounds bilaterally, no wheezing, rales,rhonchi or crepitation. No use of accessory muscles of respiration.  CARDIOVASCULAR: S1, S2 normal. No murmurs, rubs, or gallops.  ABDOMEN: Soft, nontender, nondistended. Bowel sounds present. No organomegaly or mass.  EXTREMITIES: No pedal edema, cyanosis, or clubbing.  NEUROLOGIC: Cranial nerves II through XII are intact. Muscle strength 5/5 in all extremities. Sensation intact. Gait not checked.  PSYCHIATRIC: The patient is alert and oriented x 3.  SKIN: No obvious rash, lesion, or ulcer.   Data Review     CBC w Diff: Lab Results  Component Value Date   WBC 6.3 06/28/2016   HGB 14.2 06/28/2016   HGB 13.9 10/19/2014   HCT 40.9 06/28/2016   HCT 41.3 10/19/2014   PLT  308 06/28/2016   PLT 213 10/19/2014   LYMPHOPCT 20 04/24/2016   LYMPHOPCT 26.2 10/19/2014   MONOPCT 8 04/24/2016   MONOPCT 7.6 10/19/2014   EOSPCT 6 04/24/2016   EOSPCT 7.9 10/19/2014   BASOPCT 1 04/24/2016   BASOPCT 0.9 10/19/2014   CMP: Lab Results  Component Value Date   NA 138 06/28/2016   NA 137 10/19/2014   K 3.5 06/28/2016   K 4.4 10/19/2014   CL 104 06/28/2016   CL 104 10/19/2014   CO2 28 06/28/2016   CO2 28 10/19/2014   BUN 21 (H) 06/28/2016   BUN 16 10/19/2014   CREATININE 0.91 06/28/2016   CREATININE 0.87 10/19/2014   PROT 6.5  04/24/2016   PROT 6.4 (L) 10/19/2014   ALBUMIN 4.1 04/24/2016   ALBUMIN 4.0 10/19/2014   BILITOT 1.5 (H) 04/24/2016   BILITOT 1.2 10/19/2014   ALKPHOS 62 04/24/2016   ALKPHOS 63 10/19/2014   AST 18 04/24/2016   AST 17 10/19/2014   ALT 14 (L) 04/24/2016   ALT 14 (L) 10/19/2014  .  Micro Results No results found for this or any previous visit (from the past 240 hour(s)).      Code Status Orders        Start     Ordered   06/28/16 0157  Full code  Continuous     06/28/16 0156    Code Status History    Date Active Date Inactive Code Status Order ID Comments User Context   This patient has a current code status but no historical code status.          Follow-up Information    Corey Skains, MD. Go on 07/05/2016.   Specialty:  Cardiology Why:  Time:10:00 A.M. Contact information: [facility] [place] [address] [phone]        Juluis Pitch, MD In 2 weeks.   Specialty:  Family Medicine Why:  The office will call you with an appoinment Contact information: [address] [address] [phone]           Discharge Medications   Allergies as of 06/28/2016   No Known Allergies     Medication List    TAKE these medications   atorvastatin 20 MG tablet Commonly known as:  LIPITOR TAKE 1/2 TABLET ONCE DAILY   benzonatate 100 MG capsule Commonly known as:  TESSALON Take 100 mg by mouth 3 (three) times daily as needed for cough.   diltiazem 120 MG 24 hr capsule Commonly known as:  CARDIZEM CD Take 1 capsule (120 mg total) by mouth daily.   fluticasone 50 MCG/ACT nasal spray Commonly known as:  FLONASE Place 1 spray into both nostrils 2 (two) times daily.   predniSONE 5 MG tablet Commonly known as:  DELTASONE TAKE 1 TABLET BY MOUTH  EVERY 12 HOURS   SM CALCIUM 500/VITAMIN D3 500-400 MG-UNIT tablet Generic drug:  calcium-vitamin D Take by mouth.   ZYTIGA 250 MG tablet Generic  drug:  abiraterone Acetate TAKE FOUR TABLETS (1,000 MG) ONCE EVERY DAY.          Total Time in preparing paper work, data evaluation and todays exam - 35 minutes  Dustin Flock M.D on 06/28/2016 at 4:57 PM  [facility]   Office  [phone]

## 2016-07-25 ENCOUNTER — Other Ambulatory Visit: Payer: Self-pay

## 2016-07-25 ENCOUNTER — Inpatient Hospital Stay: Payer: Medicare Other | Attending: Oncology

## 2016-07-25 DIAGNOSIS — Z79899 Other long term (current) drug therapy: Secondary | ICD-10-CM | POA: Insufficient documentation

## 2016-07-25 DIAGNOSIS — Z9079 Acquired absence of other genital organ(s): Secondary | ICD-10-CM | POA: Diagnosis not present

## 2016-07-25 DIAGNOSIS — Z7952 Long term (current) use of systemic steroids: Secondary | ICD-10-CM | POA: Insufficient documentation

## 2016-07-25 DIAGNOSIS — Z87891 Personal history of nicotine dependence: Secondary | ICD-10-CM | POA: Diagnosis not present

## 2016-07-25 DIAGNOSIS — E7800 Pure hypercholesterolemia, unspecified: Secondary | ICD-10-CM | POA: Insufficient documentation

## 2016-07-25 DIAGNOSIS — C61 Malignant neoplasm of prostate: Secondary | ICD-10-CM | POA: Insufficient documentation

## 2016-07-25 DIAGNOSIS — R7302 Impaired glucose tolerance (oral): Secondary | ICD-10-CM

## 2016-07-25 DIAGNOSIS — Z13220 Encounter for screening for lipoid disorders: Secondary | ICD-10-CM

## 2016-07-25 DIAGNOSIS — I10 Essential (primary) hypertension: Secondary | ICD-10-CM

## 2016-07-25 LAB — LIPID PANEL
Cholesterol: 180 mg/dL (ref 0–200)
HDL: 53 mg/dL (ref >40)
LDL Cholesterol: 98 mg/dL (ref 0–99)
Total CHOL/HDL Ratio: 3.4 RATIO
Triglycerides: 144 mg/dL (ref <150)
VLDL: 29 mg/dL (ref 0–40)

## 2016-07-25 LAB — COMPREHENSIVE METABOLIC PANEL
ALT: 15 U/L — ABNORMAL LOW (ref 17–63)
AST: 17 U/L (ref 15–41)
Albumin: 4.3 g/dL (ref 3.5–5.0)
Alkaline Phosphatase: 65 U/L (ref 38–126)
Anion gap: 6 (ref 5–15)
BUN: 14 mg/dL (ref 6–20)
CO2: 31 mmol/L (ref 22–32)
Calcium: 9.3 mg/dL (ref 8.9–10.3)
Chloride: 100 mmol/L — ABNORMAL LOW (ref 101–111)
Creatinine, Ser: 0.86 mg/dL (ref 0.61–1.24)
GFR calc Af Amer: >60 mL/min (ref >60)
GFR calc non Af Amer: >60 mL/min (ref >60)
Glucose, Bld: 119 mg/dL — ABNORMAL HIGH (ref 65–99)
Potassium: 4.1 mmol/L (ref 3.5–5.1)
Sodium: 137 mmol/L (ref 135–145)
Total Bilirubin: 1.6 mg/dL — ABNORMAL HIGH (ref 0.3–1.2)
Total Protein: 7.3 g/dL (ref 6.5–8.1)

## 2016-07-25 LAB — CBC WITH DIFFERENTIAL/PLATELET
Basophils Absolute: 0 10*3/uL (ref 0–0.1)
Basophils Relative: 1 %
Eosinophils Absolute: 0.3 10*3/uL (ref 0–0.7)
Eosinophils Relative: 6 %
HCT: 43.9 % (ref 40.0–52.0)
Hemoglobin: 15 g/dL (ref 13.0–18.0)
Lymphocytes Relative: 21 %
Lymphs Abs: 1 10*3/uL (ref 1.0–3.6)
MCH: 30.5 pg (ref 26.0–34.0)
MCHC: 34.2 g/dL (ref 32.0–36.0)
MCV: 89 fL (ref 80.0–100.0)
Monocytes Absolute: 0.3 10*3/uL (ref 0.2–1.0)
Monocytes Relative: 7 %
Neutro Abs: 3.2 10*3/uL (ref 1.4–6.5)
Neutrophils Relative %: 65 %
Platelets: 264 10*3/uL (ref 150–440)
RBC: 4.93 MIL/uL (ref 4.40–5.90)
RDW: 13.9 % (ref 11.5–14.5)
WBC: 4.9 10*3/uL (ref 3.8–10.6)

## 2016-07-25 LAB — PSA: PSA: <0.01 ng/mL (ref 0.00–4.00)

## 2016-07-26 LAB — HEMOGLOBIN A1C
Hgb A1c MFr Bld: 6.4 % — ABNORMAL HIGH (ref 4.8–5.6)
Mean Plasma Glucose: 137 mg/dL

## 2016-07-26 LAB — MICROALBUMIN, URINE: Microalb, Ur: 3.9 ug/mL — ABNORMAL HIGH

## 2016-07-31 NOTE — Progress Notes (Signed)
[place]  [phone] [phone]) [phone]  ID: Brandon Shelton OB: [birth year]  MR#: [id number]  [id number]  Patient Care Team: Brandon Pitch, MD as PCP - General (Family Medicine)  CHIEF COMPLAINT: Prostate cancer  INTERVAL HISTORY: Patient returns to clinic today for repeat laboratory work and further evaluation. He continues to feel well and remains asymptomatic. He continues to tolerate Zytiga and prednisone without significant side effects. He has no neurologic complaints. He denies any recent fevers or illnesses. He has a good appetite and denies weight loss. He has no chest pain or shortness of breath. He denies any nausea, vomiting, constipation, or diarrhea. He has no urinary complaints. Patient feels at his baseline and offers no specific complaints today.  REVIEW OF SYSTEMS:   Review of Systems  Constitutional: Negative for fever, malaise/fatigue and weight loss.  Respiratory: Negative.  Negative for cough and shortness of breath.   Cardiovascular: Negative.  Negative for chest pain.  Gastrointestinal: Negative.  Negative for abdominal pain, nausea and vomiting.  Genitourinary: Negative.   Musculoskeletal: Negative.   Skin: Negative for rash.  Neurological: Negative.  Negative for weakness.  Psychiatric/Behavioral: Negative.     As per HPI. Otherwise, a complete review of systems is negative.  PAST MEDICAL HISTORY: Past Medical History:  Diagnosis Date  . High cholesterol   . Prostate cancer ([place])     PAST SURGICAL HISTORY: Prostatectomy.  FAMILY HISTORY: Reviewed and unchanged. No reported history of malignancy or chronic disease.     ADVANCED DIRECTIVES:    HEALTH MAINTENANCE: Social History  Substance Use Topics  . Smoking status: Former Smoker    Types: Cigarettes    Quit date: 06/24/1972  . Smokeless tobacco: Never Used  . Alcohol use No     Colonoscopy:  PAP:  Bone density:  Lipid panel:  No Known  Allergies  Current Outpatient Prescriptions  Medication Sig Dispense Refill  . atorvastatin (LIPITOR) 20 MG tablet TAKE 1/2 TABLET ONCE DAILY    . calcium-vitamin D (SM CALCIUM 500/VITAMIN D3) 500-400 MG-UNIT per tablet Take by mouth.    . diltiazem (CARDIZEM CD) 180 MG 24 hr capsule Take 180 mg by mouth daily.     . predniSONE (DELTASONE) 5 MG tablet TAKE 1 TABLET BY MOUTH  EVERY 12 HOURS 180 tablet 1  . ZYTIGA 250 MG tablet TAKE FOUR TABLETS (1,000 MG) ONCE EVERY DAY. 120 tablet 0   No current facility-administered medications for this visit.     OBJECTIVE: Vitals:   08/01/16 1055  BP: (!) 144/77  Pulse: 60  Resp: 18  Temp: 98.5 F (36.9 C)     Body mass index is 26.49 kg/m.    ECOG FS:0 - Asymptomatic  General: Well-developed, well-nourished, no acute distress. Eyes: Pink conjunctiva, anicteric sclera. Lungs: Clear to auscultation bilaterally. Heart: Regular rate and rhythm. No rubs, murmurs, or gallops. Abdomen: Soft, nontender, nondistended. No organomegaly noted, normoactive bowel sounds. Musculoskeletal: No edema, cyanosis, or clubbing. Neuro: Alert, answering all questions appropriately. Cranial nerves grossly intact. Skin: No rashes or petechiae noted. Psych: Normal affect.  LAB RESULTS:  Lab Results  Component Value Date   NA 137 07/25/2016   K 4.1 07/25/2016   CL 100 (L) 07/25/2016   CO2 31 07/25/2016   GLUCOSE 119 (H) 07/25/2016   BUN 14 07/25/2016   CREATININE 0.86 07/25/2016   CALCIUM 9.3 07/25/2016   PROT 7.3 07/25/2016   ALBUMIN 4.3 07/25/2016   AST 17 07/25/2016   ALT 15 (L)  07/25/2016   ALKPHOS 65 07/25/2016   BILITOT 1.6 (H) 07/25/2016   GFRNONAA >60 07/25/2016   GFRAA >60 07/25/2016    Lab Results  Component Value Date   WBC 4.9 07/25/2016   NEUTROABS 3.2 07/25/2016   HGB 15.0 07/25/2016   HCT 43.9 07/25/2016   MCV 89.0 07/25/2016   PLT 264 07/25/2016   Lab Results  Component Value Date   PSA <0.01 07/25/2016     STUDIES: No  results found.  ASSESSMENT: Prostate cancer  PLAN:    1. Prostate cancer: Continue Zytiga and 5 mg of prednisone twice per day. Patient's PSA continues to be undetectable. No further intervention is needed. Discussed with patient the possibility of discontinuing treatment, but he is not interested at this time. Patient will require a bone mineral density in the near future given his chronic use of low-dose prednisone. Return to clinic in 3 months for laboratory work and then in 6 months for laboratory work and further evaluation. 2. Elevated bilirubin: Mild, chronic and unchanged.  Approximately 30 minutes was spent in discussion of which greater than 50% was consultation.  Patient expressed understanding and was in agreement with this plan. He also understands that He can call clinic at any time with any questions, concerns, or complaints.   Cancer Staging Cancer of prostate [facility]) Staging form: Prostate, AJCC 7th Edition - Clinical: Stage IV (M1, Gleason <= 6 - Well differentiated (slight anaplasia)) - Signed by Evlyn Kanner, NP on 04/19/2015   Brandon Huger, MD   08/04/2016 6:40 PM

## 2016-08-01 ENCOUNTER — Inpatient Hospital Stay (HOSPITAL_BASED_OUTPATIENT_CLINIC_OR_DEPARTMENT_OTHER): Payer: Medicare Other | Admitting: Oncology

## 2016-08-01 VITALS — BP 144/77 | HR 60 | Temp 98.5°F | Resp 18 | Wt 164.1 lb

## 2016-08-01 DIAGNOSIS — C61 Malignant neoplasm of prostate: Secondary | ICD-10-CM

## 2016-08-01 DIAGNOSIS — E7800 Pure hypercholesterolemia, unspecified: Secondary | ICD-10-CM | POA: Diagnosis not present

## 2016-08-01 DIAGNOSIS — M8580 Other specified disorders of bone density and structure, unspecified site: Secondary | ICD-10-CM

## 2016-08-01 DIAGNOSIS — Z79899 Other long term (current) drug therapy: Secondary | ICD-10-CM

## 2016-08-01 DIAGNOSIS — Z9079 Acquired absence of other genital organ(s): Secondary | ICD-10-CM

## 2016-08-01 DIAGNOSIS — Z87891 Personal history of nicotine dependence: Secondary | ICD-10-CM | POA: Diagnosis not present

## 2016-08-01 DIAGNOSIS — Z7952 Long term (current) use of systemic steroids: Secondary | ICD-10-CM | POA: Diagnosis not present

## 2016-08-01 NOTE — Progress Notes (Signed)
Patient is here for follow up he is doing well no complaints  

## 2016-09-12 ENCOUNTER — Ambulatory Visit
Admission: RE | Admit: 2016-09-12 | Discharge: 2016-09-12 | Disposition: A | Discharge status: Home / Self Care | Payer: Medicare Other | Source: Ambulatory Visit | Attending: Oncology | Admitting: Oncology

## 2016-09-12 DIAGNOSIS — C61 Malignant neoplasm of prostate: Secondary | ICD-10-CM | POA: Diagnosis present

## 2016-09-12 DIAGNOSIS — M8580 Other specified disorders of bone density and structure, unspecified site: Secondary | ICD-10-CM | POA: Diagnosis present

## 2016-09-12 DIAGNOSIS — M85852 Other specified disorders of bone density and structure, left thigh: Secondary | ICD-10-CM | POA: Diagnosis not present

## 2016-10-29 ENCOUNTER — Inpatient Hospital Stay: Payer: Medicare Other | Attending: Oncology

## 2016-10-29 DIAGNOSIS — C61 Malignant neoplasm of prostate: Secondary | ICD-10-CM | POA: Insufficient documentation

## 2016-10-29 DIAGNOSIS — M8580 Other specified disorders of bone density and structure, unspecified site: Secondary | ICD-10-CM

## 2016-10-29 LAB — COMPREHENSIVE METABOLIC PANEL
ALT: 25 U/L (ref 17–63)
AST: 24 U/L (ref 15–41)
Albumin: 3.9 g/dL (ref 3.5–5.0)
Alkaline Phosphatase: 66 U/L (ref 38–126)
Anion gap: 7 (ref 5–15)
BUN: 17 mg/dL (ref 6–20)
CO2: 28 mmol/L (ref 22–32)
Calcium: 8.9 mg/dL (ref 8.9–10.3)
Chloride: 102 mmol/L (ref 101–111)
Creatinine, Ser: 0.76 mg/dL (ref 0.61–1.24)
GFR calc Af Amer: >60 mL/min (ref >60)
GFR calc non Af Amer: >60 mL/min (ref >60)
Glucose, Bld: 117 mg/dL — ABNORMAL HIGH (ref 65–99)
Potassium: 3.9 mmol/L (ref 3.5–5.1)
Sodium: 137 mmol/L (ref 135–145)
Total Bilirubin: 1.3 mg/dL — ABNORMAL HIGH (ref 0.3–1.2)
Total Protein: 6.7 g/dL (ref 6.5–8.1)

## 2016-10-29 LAB — CBC WITH DIFFERENTIAL/PLATELET
Basophils Absolute: 0 10*3/uL (ref 0–0.1)
Basophils Relative: 1 %
Eosinophils Absolute: 0.3 10*3/uL (ref 0–0.7)
Eosinophils Relative: 5 %
HCT: 38.9 % — ABNORMAL LOW (ref 40.0–52.0)
Hemoglobin: 13.4 g/dL (ref 13.0–18.0)
Lymphocytes Relative: 16 %
Lymphs Abs: 0.8 10*3/uL — ABNORMAL LOW (ref 1.0–3.6)
MCH: 30.9 pg (ref 26.0–34.0)
MCHC: 34.5 g/dL (ref 32.0–36.0)
MCV: 89.7 fL (ref 80.0–100.0)
Monocytes Absolute: 0.3 10*3/uL (ref 0.2–1.0)
Monocytes Relative: 7 %
Neutro Abs: 3.4 10*3/uL (ref 1.4–6.5)
Neutrophils Relative %: 71 %
Platelets: 227 10*3/uL (ref 150–440)
RBC: 4.33 MIL/uL — ABNORMAL LOW (ref 4.40–5.90)
RDW: 13.4 % (ref 11.5–14.5)
WBC: 4.9 10*3/uL (ref 3.8–10.6)

## 2016-10-29 LAB — PSA: PSA: <0.01 ng/mL (ref 0.00–4.00)

## 2016-11-08 ENCOUNTER — Other Ambulatory Visit: Payer: Self-pay | Admitting: *Deleted

## 2016-11-08 MED ORDER — ABIRATERONE ACETATE 250 MG PO TABS: ORAL_TABLET | ORAL | 2 refills | Status: DC

## 2016-12-26 ENCOUNTER — Other Ambulatory Visit: Payer: Self-pay | Admitting: Oncology

## 2017-01-21 ENCOUNTER — Other Ambulatory Visit: Payer: Self-pay | Admitting: *Deleted

## 2017-01-21 MED ORDER — ABIRATERONE ACETATE 250 MG PO TABS: ORAL_TABLET | ORAL | 2 refills | Status: DC

## 2017-02-04 ENCOUNTER — Inpatient Hospital Stay: Payer: Medicare Other | Attending: Oncology

## 2017-02-04 DIAGNOSIS — C61 Malignant neoplasm of prostate: Secondary | ICD-10-CM | POA: Insufficient documentation

## 2017-02-04 DIAGNOSIS — Z79899 Other long term (current) drug therapy: Secondary | ICD-10-CM | POA: Insufficient documentation

## 2017-02-04 DIAGNOSIS — Z87891 Personal history of nicotine dependence: Secondary | ICD-10-CM | POA: Insufficient documentation

## 2017-02-04 DIAGNOSIS — E7800 Pure hypercholesterolemia, unspecified: Secondary | ICD-10-CM | POA: Insufficient documentation

## 2017-02-04 DIAGNOSIS — M8580 Other specified disorders of bone density and structure, unspecified site: Secondary | ICD-10-CM

## 2017-02-04 LAB — CBC WITH DIFFERENTIAL/PLATELET
Basophils Absolute: 0.1 10*3/uL (ref 0–0.1)
Basophils Relative: 1 %
Eosinophils Absolute: 0.3 10*3/uL (ref 0–0.7)
Eosinophils Relative: 6 %
HCT: 39.3 % — ABNORMAL LOW (ref 40.0–52.0)
Hemoglobin: 13.7 g/dL (ref 13.0–18.0)
Lymphocytes Relative: 18 %
Lymphs Abs: 1 10*3/uL (ref 1.0–3.6)
MCH: 30.6 pg (ref 26.0–34.0)
MCHC: 34.7 g/dL (ref 32.0–36.0)
MCV: 88.2 fL (ref 80.0–100.0)
Monocytes Absolute: 0.4 10*3/uL (ref 0.2–1.0)
Monocytes Relative: 7 %
Neutro Abs: 3.8 10*3/uL (ref 1.4–6.5)
Neutrophils Relative %: 68 %
Platelets: 222 10*3/uL (ref 150–440)
RBC: 4.46 MIL/uL (ref 4.40–5.90)
RDW: 13.7 % (ref 11.5–14.5)
WBC: 5.5 10*3/uL (ref 3.8–10.6)

## 2017-02-04 LAB — COMPREHENSIVE METABOLIC PANEL
ALT: 11 U/L — ABNORMAL LOW (ref 17–63)
AST: 18 U/L (ref 15–41)
Albumin: 4 g/dL (ref 3.5–5.0)
Alkaline Phosphatase: 60 U/L (ref 38–126)
Anion gap: 9 (ref 5–15)
BUN: 15 mg/dL (ref 6–20)
CO2: 27 mmol/L (ref 22–32)
Calcium: 9.2 mg/dL (ref 8.9–10.3)
Chloride: 101 mmol/L (ref 101–111)
Creatinine, Ser: 0.87 mg/dL (ref 0.61–1.24)
GFR calc Af Amer: >60 mL/min (ref >60)
GFR calc non Af Amer: >60 mL/min (ref >60)
Glucose, Bld: 133 mg/dL — ABNORMAL HIGH (ref 65–99)
Potassium: 4 mmol/L (ref 3.5–5.1)
Sodium: 137 mmol/L (ref 135–145)
Total Bilirubin: 1.4 mg/dL — ABNORMAL HIGH (ref 0.3–1.2)
Total Protein: 6.5 g/dL (ref 6.5–8.1)

## 2017-02-04 LAB — PSA: Prostatic Specific Antigen: <0.01 ng/mL (ref 0.00–4.00)

## 2017-02-10 NOTE — Progress Notes (Signed)
[place]  [phone] [phone]) [phone]  ID: IAAN OREGEL OB: 09/11/1945  MR#: [id number]  ZGY#:[id number]  Patient Care Team: Juluis Pitch, MD as PCP - General (Family Medicine)  CHIEF COMPLAINT: Prostate cancer  INTERVAL HISTORY: Patient returns to clinic today for repeat laboratory work and further evaluation. He continues to feel well and remains asymptomatic. He continues to tolerate Zytiga and prednisone without significant side effects. He is active, exercises multiple times a week and is conscientious of his weight. He has no neurologic complaints. He denies any recent fevers or illnesses. He has a good appetite and denies weight loss. He has no chest pain or shortness of breath. He denies any nausea, vomiting, constipation, or diarrhea. He has no urinary complaints. Patient feels at his baseline and offers no specific complaints today.  REVIEW OF SYSTEMS:   Review of Systems  Constitutional: Negative for fever, malaise/fatigue and weight loss.  Respiratory: Negative.  Negative for cough and shortness of breath.   Cardiovascular: Negative.  Negative for chest pain and leg swelling.  Gastrointestinal: Negative.  Negative for abdominal pain, nausea and vomiting.  Genitourinary: Negative.   Musculoskeletal: Negative.   Skin: Negative for rash.  Neurological: Negative.  Negative for sensory change and weakness.  Psychiatric/Behavioral: Negative.  The patient is not nervous/anxious.     As per HPI. Otherwise, a complete review of systems is negative.  PAST MEDICAL HISTORY: Past Medical History:  Diagnosis Date  . High cholesterol   . Prostate cancer ([place])     PAST SURGICAL HISTORY: Prostatectomy.  FAMILY HISTORY: Reviewed and unchanged. No reported history of malignancy or chronic disease.     ADVANCED DIRECTIVES:    HEALTH MAINTENANCE: Social History  Substance Use Topics  . Smoking status: Former Smoker    Types:  Cigarettes    Quit date: 06/24/1972  . Smokeless tobacco: Never Used  . Alcohol use No     Colonoscopy:  PAP:  Bone density: 08/2016  Lipid panel: 07/25/2016  No Known Allergies  Current Outpatient Prescriptions  Medication Sig Dispense Refill  . abiraterone Acetate (ZYTIGA) 250 MG tablet TAKE FOUR TABLETS (1,000 MG) ONCE EVERY DAY. 120 tablet 2  . atorvastatin (LIPITOR) 20 MG tablet TAKE 1/2 TABLET ONCE DAILY    . calcium-vitamin D (SM CALCIUM 500/VITAMIN D3) 500-400 MG-UNIT per tablet Take by mouth 2 (two) times daily.     Marland Kitchen diltiazem (CARDIZEM CD) 180 MG 24 hr capsule Take 180 mg by mouth daily.     . predniSONE (DELTASONE) 5 MG tablet TAKE 1 TABLET BY MOUTH  EVERY 12 HOURS 180 tablet 2   No current facility-administered medications for this visit.     OBJECTIVE: Vitals:   02/11/17 1109  BP: 134/74  Pulse: (!) 56  Resp: 18  Temp: 97.8 F (36.6 C)     Body mass index is 25.79 kg/m.    ECOG FS:0 - Asymptomatic  General: Well-developed, well-nourished, no acute distress. Eyes: Pink conjunctiva, anicteric sclera. Lungs: Clear to auscultation bilaterally. Heart: Regular rate and rhythm. No rubs, murmurs, or gallops. Abdomen: Soft, nontender, nondistended. No organomegaly noted, normoactive bowel sounds. Musculoskeletal: No edema, cyanosis, or clubbing. Neuro: Alert, answering all questions appropriately. Cranial nerves grossly intact. Skin: No rashes or petechiae noted. Psych: Normal affect.  LAB RESULTS:  Lab Results  Component Value Date   NA 137 02/04/2017   K 4.0 02/04/2017   CL 101 02/04/2017   CO2 27 02/04/2017   GLUCOSE 133 (H) 02/04/2017  BUN 15 02/04/2017   CREATININE 0.87 02/04/2017   CALCIUM 9.2 02/04/2017   PROT 6.5 02/04/2017   ALBUMIN 4.0 02/04/2017   AST 18 02/04/2017   ALT 11 (L) 02/04/2017   ALKPHOS 60 02/04/2017   BILITOT 1.4 (H) 02/04/2017   GFRNONAA >60 02/04/2017   GFRAA >60 02/04/2017    Lab Results  Component Value Date   WBC 5.5  02/04/2017   NEUTROABS 3.8 02/04/2017   HGB 13.7 02/04/2017   HCT 39.3 (L) 02/04/2017   MCV 88.2 02/04/2017   PLT 222 02/04/2017   Lab Results  Component Value Date   PSA <0.01 10/29/2016     STUDIES: No results found.  ASSESSMENT: Prostate cancer  PLAN:    1. Prostate cancer: Continue Zytiga and 2.5 mg of prednisone twice per day. Patient's PSA continues to be undetectable. No further intervention is needed. Discussed with patient the possibility of discontinuing treatment, but he is not interested at this time. Patient had a normal bone density scan March 2018. He takes supplemental calcium and vitamin d in the setting of chronic low dose prednisone. Will plan to repeat DEXA scan 08/2018. Return to clinic in 3 months and again in 6 months for lab work. Plan to see MD few days after 6 month labs.  2. Elevated bilirubin: Mild, chronic and unchanged.  Approximately 30 minutes was spent in discussion of which greater than 50% was consultation.  Patient expressed understanding and was in agreement with this plan. He also understands that He can call clinic at any time with any questions, concerns, or complaints.   Cancer Staging Cancer of prostate [facility]) Staging form: Prostate, AJCC 7th Edition - Clinical: Stage IV (M1, Gleason <= 6 - Well differentiated (slight anaplasia)) - Signed by Evlyn Kanner, NP on 04/19/2015  Beverely Risen. Zenia Resides, NP 02/11/17 12:10PM  Patient was seen and evaluated independently and I agree with the assessment and plan as dictated above.  [name], MD 02/13/17 10:24 AM

## 2017-02-11 ENCOUNTER — Inpatient Hospital Stay (HOSPITAL_BASED_OUTPATIENT_CLINIC_OR_DEPARTMENT_OTHER): Payer: Medicare Other | Admitting: Oncology

## 2017-02-11 ENCOUNTER — Encounter: Payer: Self-pay | Admitting: Oncology

## 2017-02-11 VITALS — BP 134/74 | HR 56 | Temp 97.8°F | Resp 18 | Ht 66.0 in | Wt 159.8 lb

## 2017-02-11 DIAGNOSIS — C7800 Secondary malignant neoplasm of unspecified lung: Secondary | ICD-10-CM | POA: Diagnosis not present

## 2017-02-11 DIAGNOSIS — Z87891 Personal history of nicotine dependence: Secondary | ICD-10-CM | POA: Diagnosis not present

## 2017-02-11 DIAGNOSIS — C61 Malignant neoplasm of prostate: Secondary | ICD-10-CM

## 2017-02-11 DIAGNOSIS — Z79899 Other long term (current) drug therapy: Secondary | ICD-10-CM | POA: Diagnosis not present

## 2017-02-11 MED ORDER — PREDNISONE 5 MG PO TABS: 2.5000 mg | ORAL_TABLET | Freq: Two times a day (BID) | ORAL | 1 refills | Status: DC

## 2017-02-11 NOTE — Progress Notes (Signed)
No complaints, doing well on zytiga

## 2017-04-29 ENCOUNTER — Telehealth: Payer: Self-pay | Admitting: Oncology

## 2017-04-29 NOTE — Telephone Encounter (Signed)
Oral Oncology Patient Advocate Encounter  Received a refill request from [place]. Dr. Grayland Ormond signed and I faxed back .  [phone]   [place] Patient Advocate [phone] 04/29/2017 9:17 AM

## 2017-05-06 ENCOUNTER — Inpatient Hospital Stay: Payer: Medicare Other | Attending: Oncology | Admitting: *Deleted

## 2017-05-06 DIAGNOSIS — C61 Malignant neoplasm of prostate: Secondary | ICD-10-CM

## 2017-05-06 LAB — CBC WITH DIFFERENTIAL/PLATELET
Basophils Absolute: 0 10*3/uL (ref 0–0.1)
Basophils Relative: 1 %
Eosinophils Absolute: 0.3 10*3/uL (ref 0–0.7)
Eosinophils Relative: 6 %
HCT: 44 % (ref 40.0–52.0)
Hemoglobin: 14.8 g/dL (ref 13.0–18.0)
Lymphocytes Relative: 18 %
Lymphs Abs: 0.8 10*3/uL — ABNORMAL LOW (ref 1.0–3.6)
MCH: 30.4 pg (ref 26.0–34.0)
MCHC: 33.7 g/dL (ref 32.0–36.0)
MCV: 90.3 fL (ref 80.0–100.0)
Monocytes Absolute: 0.3 10*3/uL (ref 0.2–1.0)
Monocytes Relative: 7 %
Neutro Abs: 3.1 10*3/uL (ref 1.4–6.5)
Neutrophils Relative %: 68 %
Platelets: 240 10*3/uL (ref 150–440)
RBC: 4.87 MIL/uL (ref 4.40–5.90)
RDW: 13.9 % (ref 11.5–14.5)
WBC: 4.5 10*3/uL (ref 3.8–10.6)

## 2017-05-06 LAB — COMPREHENSIVE METABOLIC PANEL
ALT: 13 U/L — ABNORMAL LOW (ref 17–63)
AST: 18 U/L (ref 15–41)
Albumin: 4.2 g/dL (ref 3.5–5.0)
Alkaline Phosphatase: 65 U/L (ref 38–126)
Anion gap: 6 (ref 5–15)
BUN: 16 mg/dL (ref 6–20)
CO2: 28 mmol/L (ref 22–32)
Calcium: 9.4 mg/dL (ref 8.9–10.3)
Chloride: 103 mmol/L (ref 101–111)
Creatinine, Ser: 0.68 mg/dL (ref 0.61–1.24)
GFR calc Af Amer: >60 mL/min (ref >60)
GFR calc non Af Amer: >60 mL/min (ref >60)
Glucose, Bld: 98 mg/dL (ref 65–99)
Potassium: 3.8 mmol/L (ref 3.5–5.1)
Sodium: 137 mmol/L (ref 135–145)
Total Bilirubin: 1 mg/dL (ref 0.3–1.2)
Total Protein: 7 g/dL (ref 6.5–8.1)

## 2017-05-06 LAB — PSA: Prostatic Specific Antigen: <0.01 ng/mL (ref 0.00–4.00)

## 2017-08-05 ENCOUNTER — Inpatient Hospital Stay: Payer: Medicare Other | Attending: Oncology

## 2017-08-05 DIAGNOSIS — C61 Malignant neoplasm of prostate: Secondary | ICD-10-CM

## 2017-08-05 LAB — CBC WITH DIFFERENTIAL/PLATELET
Basophils Absolute: 0 10*3/uL (ref 0–0.1)
Basophils Relative: 1 %
Eosinophils Absolute: 0.3 10*3/uL (ref 0–0.7)
Eosinophils Relative: 5 %
HCT: 40.3 % (ref 40.0–52.0)
Hemoglobin: 13.7 g/dL (ref 13.0–18.0)
Lymphocytes Relative: 12 %
Lymphs Abs: 0.7 10*3/uL — ABNORMAL LOW (ref 1.0–3.6)
MCH: 30.8 pg (ref 26.0–34.0)
MCHC: 33.9 g/dL (ref 32.0–36.0)
MCV: 90.8 fL (ref 80.0–100.0)
Monocytes Absolute: 0.4 10*3/uL (ref 0.2–1.0)
Monocytes Relative: 7 %
Neutro Abs: 4.3 10*3/uL (ref 1.4–6.5)
Neutrophils Relative %: 75 %
Platelets: 222 10*3/uL (ref 150–440)
RBC: 4.44 MIL/uL (ref 4.40–5.90)
RDW: 14 % (ref 11.5–14.5)
WBC: 5.6 10*3/uL (ref 3.8–10.6)

## 2017-08-05 LAB — COMPREHENSIVE METABOLIC PANEL
ALT: 14 U/L — ABNORMAL LOW (ref 17–63)
AST: 17 U/L (ref 15–41)
Albumin: 3.9 g/dL (ref 3.5–5.0)
Alkaline Phosphatase: 57 U/L (ref 38–126)
Anion gap: 6 (ref 5–15)
BUN: 16 mg/dL (ref 6–20)
CO2: 29 mmol/L (ref 22–32)
Calcium: 9.1 mg/dL (ref 8.9–10.3)
Chloride: 102 mmol/L (ref 101–111)
Creatinine, Ser: 1.01 mg/dL (ref 0.61–1.24)
GFR calc Af Amer: >60 mL/min (ref >60)
GFR calc non Af Amer: >60 mL/min (ref >60)
Glucose, Bld: 106 mg/dL — ABNORMAL HIGH (ref 65–99)
Potassium: 4.1 mmol/L (ref 3.5–5.1)
Sodium: 137 mmol/L (ref 135–145)
Total Bilirubin: 1.3 mg/dL — ABNORMAL HIGH (ref 0.3–1.2)
Total Protein: 6.4 g/dL — ABNORMAL LOW (ref 6.5–8.1)

## 2017-08-05 LAB — PSA: Prostatic Specific Antigen: <0.01 ng/mL (ref 0.00–4.00)

## 2017-08-10 NOTE — Progress Notes (Signed)
[place]  Telephone:(336) [phone] [phone]) [phone]  ID: Brandon Shelton OB: 1945/07/21  MR#: [id number]  JOA#:[id number]  Patient Care Team: Brandon Pitch, MD as PCP - General (Family Medicine)  CHIEF COMPLAINT: Prostate cancer  INTERVAL HISTORY: Patient returns to clinic today for repeat laboratory work and further evaluation. He continues to feel well and remains asymptomatic. He continues to tolerate Zytiga and prednisone without significant side effects. He has no neurologic complaints. He denies any recent fevers or illnesses. He has a good appetite and denies weight loss. He has no chest pain or shortness of breath. He denies any nausea, vomiting, constipation, or diarrhea. He has no urinary complaints. Patient feels at his baseline and offers no specific complaints today.  REVIEW OF SYSTEMS:   Review of Systems  Constitutional: Negative.  Negative for fever, malaise/fatigue and weight loss.  Respiratory: Negative.  Negative for cough and shortness of breath.   Cardiovascular: Negative.  Negative for chest pain and leg swelling.  Gastrointestinal: Negative.  Negative for abdominal pain, nausea and vomiting.  Genitourinary: Negative.   Musculoskeletal: Negative.   Skin: Negative.  Negative for rash.  Neurological: Negative.  Negative for weakness.  Endo/Heme/Allergies: Bruises/bleeds easily.  Psychiatric/Behavioral: Negative.  The patient is not nervous/anxious.     As per HPI. Otherwise, a complete review of systems is negative.  PAST MEDICAL HISTORY: Past Medical History:  Diagnosis Date  . High cholesterol   . Prostate cancer ([place])     PAST SURGICAL HISTORY: Prostatectomy.  FAMILY HISTORY: Reviewed and unchanged. No reported history of malignancy or chronic disease.     ADVANCED DIRECTIVES:    HEALTH MAINTENANCE: Social History   Tobacco Use  . Smoking status: Former Smoker    Types: Cigarettes    Last attempt to quit: 06/24/1972    Years since quitting: 45.1  . Smokeless tobacco: Never Used  Substance Use Topics  . Alcohol use: No  . Drug use: No     Colonoscopy:  PAP:  Bone density:  Lipid panel:  No Known Allergies  Current Outpatient Medications  Medication Sig Dispense Refill  . abiraterone Acetate (ZYTIGA) 250 MG tablet TAKE FOUR TABLETS (1,000 MG) ONCE EVERY DAY. 120 tablet 2  . atorvastatin (LIPITOR) 20 MG tablet TAKE 1/2 TABLET ONCE DAILY    . calcium-vitamin D (SM CALCIUM 500/VITAMIN D3) 500-400 MG-UNIT per tablet Take by mouth 2 (two) times daily.     . Cholecalciferol (VITAMIN D) 2000 units CAPS Take by mouth.    . diltiazem (CARDIZEM CD) 180 MG 24 hr capsule Take 180 mg by mouth daily.     . predniSONE (DELTASONE) 5 MG tablet Take 0.5 tablets (2.5 mg total) by mouth every 12 (twelve) hours. 90 tablet 1   No current facility-administered medications for this visit.     OBJECTIVE: Vitals:   08/13/17 1354  BP: 134/82  Pulse: 64  Resp: 16  Temp: (!) 97.1 F (36.2 C)     Body mass index is 25.66 kg/m.    ECOG FS:0 - Asymptomatic  General: Well-developed, well-nourished, no acute distress. Eyes: Pink conjunctiva, anicteric sclera. Lungs: Clear to auscultation bilaterally. Heart: Regular rate and rhythm. No rubs, murmurs, or gallops. Abdomen: Soft, nontender, nondistended. No organomegaly noted, normoactive bowel sounds. Musculoskeletal: No edema, cyanosis, or clubbing. Neuro: Alert, answering all questions appropriately. Cranial nerves grossly intact. Skin: No rashes or petechiae noted. Psych: Normal affect.  LAB RESULTS:  Lab Results  Component Value Date   NA 137 08/05/2017  K 4.1 08/05/2017   CL 102 08/05/2017   CO2 29 08/05/2017   GLUCOSE 106 (H) 08/05/2017   BUN 16 08/05/2017   CREATININE 1.01 08/05/2017   CALCIUM 9.1 08/05/2017   PROT 6.4 (L) 08/05/2017   ALBUMIN 3.9 08/05/2017   AST 17 08/05/2017   ALT 14 (L) 08/05/2017   ALKPHOS 57 08/05/2017   BILITOT 1.3 (H)  08/05/2017   GFRNONAA >60 08/05/2017   GFRAA >60 08/05/2017    Lab Results  Component Value Date   WBC 5.6 08/05/2017   NEUTROABS 4.3 08/05/2017   HGB 13.7 08/05/2017   HCT 40.3 08/05/2017   MCV 90.8 08/05/2017   PLT 222 08/05/2017   Lab Results  Component Value Date   PSA <0.01 10/29/2016     STUDIES: No results found.  ASSESSMENT: Prostate cancer  PLAN:    1. Prostate cancer: Continue Zytiga and 2.5 mg of prednisone twice per day. Patient's PSA continues to be undetectable. No further intervention is needed. Again discussed with patient the possibility of discontinuing treatment, but he is not interested at this time. Return to clinic in 3 months for laboratory work and then in 6 months for laboratory work and further evaluation. 2. Elevated bilirubin: Mild, chronic and unchanged. 3.  DEXA scan: Because of the patient's chronic use of low-dose prednisone a bone marrow density was ordered.  Bone mineral density on September 12, 2016 reported T score of -0.9 which is normal.  Repeat in March 2020.Marland Kitchen   Approximately 30 minutes was spent in discussion of which greater than 50% was consultation.  Patient expressed understanding and was in agreement with this plan. He also understands that He can call clinic at any time with any questions, concerns, or complaints.   Cancer Staging Cancer of prostate [facility]) Staging form: Prostate, AJCC 7th Edition - Clinical: Stage IV (M1, Gleason <= 6 - Well differentiated (slight anaplasia)) - Signed by Brandon Kanner, NP on 04/19/2015   Brandon Huger, MD   08/13/2017 2:23 PM

## 2017-08-13 ENCOUNTER — Encounter: Payer: Self-pay | Admitting: Oncology

## 2017-08-13 ENCOUNTER — Inpatient Hospital Stay (HOSPITAL_BASED_OUTPATIENT_CLINIC_OR_DEPARTMENT_OTHER): Payer: Medicare Other | Admitting: Oncology

## 2017-08-13 VITALS — BP 134/82 | HR 64 | Temp 97.1°F | Resp 16 | Wt 159.0 lb

## 2017-08-13 DIAGNOSIS — C61 Malignant neoplasm of prostate: Secondary | ICD-10-CM | POA: Diagnosis not present

## 2017-09-10 ENCOUNTER — Other Ambulatory Visit: Payer: Self-pay | Admitting: Oncology

## 2017-09-10 MED ORDER — ABIRATERONE ACETATE 250 MG PO TABS: 1000.0000 mg | ORAL_TABLET | Freq: Every day | ORAL | 5 refills | Status: DC

## 2017-09-11 ENCOUNTER — Telehealth: Payer: Self-pay | Admitting: Pharmacist

## 2017-09-11 NOTE — Telephone Encounter (Signed)
Oral Chemotherapy Pharmacist Encounter   Called [place] and they actually did not need a refill prescription for the generic abiraterone. They just needed to know if it was okay for the patient to switch to the generic product for cost savings. Informed [facility] that it was okay for the patient to switch to generic if there was cost savings and the patient wanted to switch.    Darl Pikes, PharmD, BCPS Hematology/Oncology Clinical Pharmacist [facility] [phone]  09/11/2017 4:51 PM

## 2017-11-11 ENCOUNTER — Other Ambulatory Visit: Payer: Self-pay | Admitting: *Deleted

## 2017-11-11 ENCOUNTER — Inpatient Hospital Stay: Payer: Medicare Other | Attending: Oncology

## 2017-11-11 DIAGNOSIS — C61 Malignant neoplasm of prostate: Secondary | ICD-10-CM | POA: Diagnosis not present

## 2017-11-11 DIAGNOSIS — C7800 Secondary malignant neoplasm of unspecified lung: Secondary | ICD-10-CM | POA: Diagnosis not present

## 2017-11-11 LAB — COMPREHENSIVE METABOLIC PANEL
ALT: 13 U/L — ABNORMAL LOW (ref 17–63)
AST: 19 U/L (ref 15–41)
Albumin: 3.9 g/dL (ref 3.5–5.0)
Alkaline Phosphatase: 64 U/L (ref 38–126)
Anion gap: 9 (ref 5–15)
BUN: 16 mg/dL (ref 6–20)
CO2: 27 mmol/L (ref 22–32)
Calcium: 9.4 mg/dL (ref 8.9–10.3)
Chloride: 102 mmol/L (ref 101–111)
Creatinine, Ser: 1.14 mg/dL (ref 0.61–1.24)
GFR calc Af Amer: >60 mL/min (ref >60)
GFR calc non Af Amer: >60 mL/min (ref >60)
Glucose, Bld: 105 mg/dL — ABNORMAL HIGH (ref 65–99)
Potassium: 4.5 mmol/L (ref 3.5–5.1)
Sodium: 138 mmol/L (ref 135–145)
Total Bilirubin: 1 mg/dL (ref 0.3–1.2)
Total Protein: 6.9 g/dL (ref 6.5–8.1)

## 2017-11-11 LAB — CBC WITH DIFFERENTIAL/PLATELET
Basophils Absolute: 0.1 10*3/uL (ref 0–0.1)
Basophils Relative: 1 %
Eosinophils Absolute: 0.4 10*3/uL (ref 0–0.7)
Eosinophils Relative: 7 %
HCT: 40.4 % (ref 40.0–52.0)
Hemoglobin: 14 g/dL (ref 13.0–18.0)
Lymphocytes Relative: 20 %
Lymphs Abs: 1 10*3/uL (ref 1.0–3.6)
MCH: 31.3 pg (ref 26.0–34.0)
MCHC: 34.6 g/dL (ref 32.0–36.0)
MCV: 90.4 fL (ref 80.0–100.0)
Monocytes Absolute: 0.4 10*3/uL (ref 0.2–1.0)
Monocytes Relative: 7 %
Neutro Abs: 3.4 10*3/uL (ref 1.4–6.5)
Neutrophils Relative %: 65 %
Platelets: 221 10*3/uL (ref 150–440)
RBC: 4.47 MIL/uL (ref 4.40–5.90)
RDW: 13.5 % (ref 11.5–14.5)
WBC: 5.2 10*3/uL (ref 3.8–10.6)

## 2017-11-11 LAB — PSA: Prostatic Specific Antigen: <0.01 ng/mL (ref 0.00–4.00)

## 2017-11-13 ENCOUNTER — Other Ambulatory Visit: Payer: Self-pay | Admitting: *Deleted

## 2017-11-13 DIAGNOSIS — C61 Malignant neoplasm of prostate: Secondary | ICD-10-CM

## 2017-11-13 MED ORDER — ABIRATERONE ACETATE 250 MG PO TABS: 1000.0000 mg | ORAL_TABLET | Freq: Every day | ORAL | 5 refills | Status: DC

## 2017-11-13 NOTE — Addendum Note (Signed)
Addended by: Darl Pikes on: 11/13/2017 11:27 AM   Modules accepted: Orders

## 2017-11-13 NOTE — Telephone Encounter (Signed)
Patient called asking for refill of his Zytiga Next follow up appointment August but he just came in for lab only appointment  Dx:  Prostate cancer ([place])   Ref Range & Units 2d ago  WBC 3.8 - 10.6 K/uL 5.2   RBC 4.40 - 5.90 MIL/uL 4.47   Hemoglobin 13.0 - 18.0 g/dL 14.0   HCT 40.0 - 52.0 % 40.4   MCV 80.0 - 100.0 fL 90.4   MCH 26.0 - 34.0 pg 31.3   MCHC 32.0 - 36.0 g/dL 34.6   RDW 11.5 - 14.5 % 13.5   Platelets 150 - 440 K/uL 221   Neutrophils Relative % % 65   Neutro Abs 1.4 - 6.5 K/uL 3.4   Lymphocytes Relative % 20   Lymphs Abs 1.0 - 3.6 K/uL 1.0   Monocytes Relative % 7   Monocytes Absolute 0.2 - 1.0 K/uL 0.4   Eosinophils Relative % 7   Eosinophils Absolute 0 - 0.7 K/uL 0.4   Basophils Relative % 1   Basophils Absolute 0 - 0.1 K/uL 0.1   Comment: Performed at [facility], [address] [place], [place] [address]  Resulting Agency  [facility] CLIN LAB      Specimen Collected: 11/11/17 10:18 Last Resulted: 11/11/17 10:24       Dx:  Prostate cancer ([place])   Ref Range & Units 2d ago  Prostatic Specific Antigen 0.00 - 4.00 ng/mL <0.01        Dx:  Prostate cancer ([place])   Ref Range & Units 2d ago  Sodium 135 - 145 mmol/L 138   Potassium 3.5 - 5.1 mmol/L 4.5   Chloride 101 - 111 mmol/L 102   CO2 22 - 32 mmol/L 27   Glucose, Bld 65 - 99 mg/dL 105High    BUN 6 - 20 mg/dL 16   Creatinine, Ser 0.61 - 1.24 mg/dL 1.14   Calcium 8.9 - 10.3 mg/dL 9.4   Total Protein 6.5 - 8.1 g/dL 6.9   Albumin 3.5 - 5.0 g/dL 3.9   AST 15 - 41 U/L 19   ALT 17 - 63 U/L 13Low    Alkaline Phosphatase 38 - 126 U/L 64   Total Bilirubin 0.3 - 1.2 mg/dL 1.0   GFR calc non Af Amer >60 mL/min >60   GFR calc Af Amer >60 mL/min >60   Comment: (NOTE)  The eGFR has been calculated using the CKD EPI equation.  This calculation has not been validated in all clinical situations.  eGFR's persistently <60 mL/min signify possible Chronic Kidney  Disease.   Anion gap 5 - 15 9   Comment: Performed at  [facility], [address] [place], [place] [address]  Resulting Agency  [facility] CLIN LAB      Specimen Collected: 11/11/17 10:18 Last Resulted: 11/11/17 10:35

## 2018-02-10 ENCOUNTER — Other Ambulatory Visit: Payer: Self-pay | Admitting: *Deleted

## 2018-02-10 ENCOUNTER — Inpatient Hospital Stay: Payer: Medicare Other | Attending: Oncology

## 2018-02-10 DIAGNOSIS — C61 Malignant neoplasm of prostate: Secondary | ICD-10-CM | POA: Insufficient documentation

## 2018-02-10 DIAGNOSIS — Z87891 Personal history of nicotine dependence: Secondary | ICD-10-CM | POA: Diagnosis not present

## 2018-02-10 LAB — CBC WITH DIFFERENTIAL/PLATELET
Basophils Absolute: 0 10*3/uL (ref 0–0.1)
Basophils Relative: 1 %
Eosinophils Absolute: 0.3 10*3/uL (ref 0–0.7)
Eosinophils Relative: 7 %
HCT: 41.9 % (ref 40.0–52.0)
Hemoglobin: 14.1 g/dL (ref 13.0–18.0)
Lymphocytes Relative: 19 %
Lymphs Abs: 0.9 10*3/uL — ABNORMAL LOW (ref 1.0–3.6)
MCH: 30.8 pg (ref 26.0–34.0)
MCHC: 33.8 g/dL (ref 32.0–36.0)
MCV: 91.1 fL (ref 80.0–100.0)
Monocytes Absolute: 0.3 10*3/uL (ref 0.2–1.0)
Monocytes Relative: 5 %
Neutro Abs: 3.3 10*3/uL (ref 1.4–6.5)
Neutrophils Relative %: 68 %
Platelets: 217 10*3/uL (ref 150–440)
RBC: 4.59 MIL/uL (ref 4.40–5.90)
RDW: 14.1 % (ref 11.5–14.5)
WBC: 4.8 10*3/uL (ref 3.8–10.6)

## 2018-02-10 LAB — COMPREHENSIVE METABOLIC PANEL
ALT: 12 U/L (ref 0–44)
AST: 15 U/L (ref 15–41)
Albumin: 4.1 g/dL (ref 3.5–5.0)
Alkaline Phosphatase: 71 U/L (ref 38–126)
Anion gap: 8 (ref 5–15)
BUN: 16 mg/dL (ref 8–23)
CO2: 30 mmol/L (ref 22–32)
Calcium: 9.4 mg/dL (ref 8.9–10.3)
Chloride: 102 mmol/L (ref 98–111)
Creatinine, Ser: 0.87 mg/dL (ref 0.61–1.24)
GFR calc Af Amer: >60 mL/min (ref >60)
GFR calc non Af Amer: >60 mL/min (ref >60)
Glucose, Bld: 183 mg/dL — ABNORMAL HIGH (ref 70–99)
Potassium: 3.9 mmol/L (ref 3.5–5.1)
Sodium: 140 mmol/L (ref 135–145)
Total Bilirubin: 1.5 mg/dL — ABNORMAL HIGH (ref 0.3–1.2)
Total Protein: 6.6 g/dL (ref 6.5–8.1)

## 2018-02-10 NOTE — Progress Notes (Signed)
[place]  [phone] [phone]) [phone]  ID: Brandon Shelton OB: 07/02/1945  MR#: [id number]  FGH#:[id number]  Patient Care Team: Juluis Pitch, MD as PCP - General (Family Medicine)  CHIEF COMPLAINT: Prostate cancer  INTERVAL HISTORY: Patient returns to clinic today for repeat laboratory work and routine evaluation.  He continues to tolerate Zytiga and low-dose prednisone without significant side effects.  He currently feels well and is asymptomatic. He has no neurologic complaints. He denies any recent fevers or illnesses. He has a good appetite and denies weight loss. He has no chest pain or shortness of breath. He denies any nausea, vomiting, constipation, or diarrhea. He has no urinary complaints.  Patient offers no specific complaints today.  REVIEW OF SYSTEMS:   Review of Systems  Constitutional: Negative.  Negative for fever, malaise/fatigue and weight loss.  Respiratory: Negative.  Negative for cough and shortness of breath.   Cardiovascular: Negative.  Negative for chest pain and leg swelling.  Gastrointestinal: Negative.  Negative for abdominal pain, nausea and vomiting.  Genitourinary: Negative.  Negative for frequency.  Musculoskeletal: Negative.  Negative for back pain.  Skin: Negative.  Negative for rash.  Neurological: Negative.  Negative for focal weakness, weakness and headaches.  Endo/Heme/Allergies: Does not bruise/bleed easily.  Psychiatric/Behavioral: Negative.  The patient is not nervous/anxious.     As per HPI. Otherwise, a complete review of systems is negative.  PAST MEDICAL HISTORY: Past Medical History:  Diagnosis Date  . High cholesterol   . Prostate cancer ([place])     PAST SURGICAL HISTORY: Prostatectomy.  FAMILY HISTORY: Reviewed and unchanged. No reported history of malignancy or chronic disease.     ADVANCED DIRECTIVES:    HEALTH MAINTENANCE: Social History   Tobacco Use  . Smoking status: Former  Smoker    Types: Cigarettes    Last attempt to quit: 06/24/1972    Years since quitting: 45.6  . Smokeless tobacco: Never Used  Substance Use Topics  . Alcohol use: No  . Drug use: No     Colonoscopy:  PAP:  Bone density:  Lipid panel:  No Known Allergies  Current Outpatient Medications  Medication Sig Dispense Refill  . abiraterone acetate (ZYTIGA) 250 MG tablet Take 4 tablets (1,000 mg total) by mouth daily. Take on an empty stomach 1 hour before or 2 hours after a meal 120 tablet 5  . atorvastatin (LIPITOR) 20 MG tablet TAKE 1/2 TABLET ONCE DAILY    . calcium-vitamin D (SM CALCIUM 500/VITAMIN D3) 500-400 MG-UNIT per tablet Take by mouth 2 (two) times daily.     . Cholecalciferol (VITAMIN D) 2000 units CAPS Take by mouth.    . diltiazem (CARDIZEM CD) 180 MG 24 hr capsule Take 180 mg by mouth daily.     . predniSONE (DELTASONE) 5 MG tablet Take 0.5 tablets (2.5 mg total) by mouth every 12 (twelve) hours. 90 tablet 1   No current facility-administered medications for this visit.     OBJECTIVE: Vitals:   02/11/18 1422  BP: (!) 144/87  Pulse: 68  Resp: 18  Temp: (!) 96.5 F (35.8 C)     Body mass index is 26.45 kg/m.    ECOG FS:0 - Asymptomatic  General: Well-developed, well-nourished, no acute distress. Eyes: Pink conjunctiva, anicteric sclera. HEENT: Normocephalic, moist mucous membranes. Lungs: Clear to auscultation bilaterally. Heart: Regular rate and rhythm. No rubs, murmurs, or gallops. Abdomen: Soft, nontender, nondistended. No organomegaly noted, normoactive bowel sounds. Musculoskeletal: No edema, cyanosis, or clubbing. Neuro:  Alert, answering all questions appropriately. Cranial nerves grossly intact. Skin: No rashes or petechiae noted. Psych: Normal affect.  LAB RESULTS:  Lab Results  Component Value Date   NA 140 02/10/2018   K 3.9 02/10/2018   CL 102 02/10/2018   CO2 30 02/10/2018   GLUCOSE 183 (H) 02/10/2018   BUN 16 02/10/2018   CREATININE 0.87  02/10/2018   CALCIUM 9.4 02/10/2018   PROT 6.6 02/10/2018   ALBUMIN 4.1 02/10/2018   AST 15 02/10/2018   ALT 12 02/10/2018   ALKPHOS 71 02/10/2018   BILITOT 1.5 (H) 02/10/2018   GFRNONAA >60 02/10/2018   GFRAA >60 02/10/2018    Lab Results  Component Value Date   WBC 4.8 02/10/2018   NEUTROABS 3.3 02/10/2018   HGB 14.1 02/10/2018   HCT 41.9 02/10/2018   MCV 91.1 02/10/2018   PLT 217 02/10/2018   Lab Results  Component Value Date   PSA <0.01 10/29/2016     STUDIES: No results found.  ASSESSMENT: Prostate cancer  PLAN:    1. Prostate cancer: No evidence of disease.  Continue Zytiga and 2.5 mg of prednisone twice per day. Patient's PSA continues to be undetectable and today's result is less than 0.01.  We again discussed with patient the possibility of discontinuing treatment, but he is not interested at this time. Return to clinic in 3 months for laboratory work and and then after patient's bone mineral density in March for further evaluation. 2. Elevated bilirubin: Chronic and essentially unchanged.  Patient's total bilirubin is 1.5 today. 3.  DEXA scan: Because of the patient's chronic use of low-dose prednisone a bone marrow density was ordered.  Bone mineral density on September 12, 2016 reported T score of -0.9 which is normal.  Repeat in March 2020.  I spent a total of 30 minutes face-to-face with the patient of which greater than 50% of the visit was spent in counseling and coordination of care as detailed above.   Patient expressed understanding and was in agreement with this plan. He also understands that He can call clinic at any time with any questions, concerns, or complaints.   Cancer Staging Cancer of prostate [facility]) Staging form: Prostate, AJCC 7th Edition - Clinical: Stage IV (M1, Gleason <= 6 - Well differentiated (slight anaplasia)) - Signed by Evlyn Kanner, NP on 04/19/2015   [name], MD   02/14/2018 9:40 AM

## 2018-02-11 ENCOUNTER — Other Ambulatory Visit: Payer: Self-pay

## 2018-02-11 ENCOUNTER — Inpatient Hospital Stay (HOSPITAL_BASED_OUTPATIENT_CLINIC_OR_DEPARTMENT_OTHER): Payer: Medicare Other | Admitting: Oncology

## 2018-02-11 ENCOUNTER — Other Ambulatory Visit: Payer: Medicare Other

## 2018-02-11 VITALS — BP 144/87 | HR 68 | Temp 96.5°F | Resp 18 | Wt 163.9 lb

## 2018-02-11 DIAGNOSIS — C61 Malignant neoplasm of prostate: Secondary | ICD-10-CM | POA: Diagnosis not present

## 2018-02-11 LAB — PSA: Prostatic Specific Antigen: <0.01 ng/mL (ref 0.00–4.00)

## 2018-02-11 NOTE — Progress Notes (Signed)
Here for follow up. Per pt  Feeling fine"

## 2018-04-17 ENCOUNTER — Telehealth: Payer: Self-pay | Admitting: *Deleted

## 2018-04-17 NOTE — Telephone Encounter (Signed)
Patient called and states Dr Lovie Macadamia wants an A1C drawn with his next lab appointment. I explained that we need an order faxed from Dr Lovie Macadamia with diagnosis code to be able to drawn it

## 2018-04-22 ENCOUNTER — Telehealth: Payer: Self-pay | Admitting: *Deleted

## 2018-04-22 DIAGNOSIS — C61 Malignant neoplasm of prostate: Secondary | ICD-10-CM

## 2018-04-22 MED ORDER — PREDNISONE 5 MG PO TABS: 2.5000 mg | ORAL_TABLET | Freq: Two times a day (BID) | ORAL | 1 refills | Status: DC

## 2018-04-22 NOTE — Telephone Encounter (Signed)
Refilled per Dr. Grayland Ormond

## 2018-05-01 ENCOUNTER — Other Ambulatory Visit: Payer: Self-pay | Admitting: *Deleted

## 2018-05-01 DIAGNOSIS — C61 Malignant neoplasm of prostate: Secondary | ICD-10-CM

## 2018-05-01 MED ORDER — ABIRATERONE ACETATE 250 MG PO TABS: 1000.0000 mg | ORAL_TABLET | Freq: Every day | ORAL | 5 refills | Status: DC

## 2018-05-01 NOTE — Telephone Encounter (Signed)
)     Ref Range & Units 2mo ago  WBC 3.8 - 10.6 K/uL 4.8   RBC 4.40 - 5.90 MIL/uL 4.59   Hemoglobin 13.0 - 18.0 g/dL 14.1   HCT 40.0 - 52.0 % 41.9   MCV 80.0 - 100.0 fL 91.1   MCH 26.0 - 34.0 pg 30.8   MCHC 32.0 - 36.0 g/dL 33.8   RDW 11.5 - 14.5 % 14.1   Platelets 150 - 440 K/uL 217   Neutrophils Relative % % 68   Neutro Abs 1.4 - 6.5 K/uL 3.3   Lymphocytes Relative % 19   Lymphs Abs 1.0 - 3.6 K/uL 0.9Low    Monocytes Relative % 5   Monocytes Absolute 0.2 - 1.0 K/uL 0.3   Eosinophils Relative % 7   Eosinophils Absolute 0 - 0.7 K/uL 0.3   Basophils Relative % 1   Basophils Absolute 0 - 0.1 K/uL 0.0   Comment: Performed at [facility], [address] [place], [place] [address]  Resulting Agency  [facility]      Specimen Collected: 02/10/18 10:08  Last Resulted: 02/10/18 10:20       )   Ref Range & Units 2mo ago  Sodium 135 - 145 mmol/L 140   Potassium 3.5 - 5.1 mmol/L 3.9   Chloride 98 - 111 mmol/L 102   CO2 22 - 32 mmol/L 30   Glucose, Bld 70 - 99 mg/dL 183High    BUN 8 - 23 mg/dL 16   Creatinine, Ser 0.61 - 1.24 mg/dL 0.87   Calcium 8.9 - 10.3 mg/dL 9.4   Total Protein 6.5 - 8.1 g/dL 6.6   Albumin 3.5 - 5.0 g/dL 4.1   AST 15 - 41 U/L 15   ALT 0 - 44 U/L 12   Alkaline Phosphatase 38 - 126 U/L 71   Total Bilirubin 0.3 - 1.2 mg/dL 1.5High    GFR calc non Af Amer >60 mL/min >60   GFR calc Af Amer >60 mL/min >60   Comment: (NOTE)  The eGFR has been calculated using the CKD EPI equation.  This calculation has not been validated in all clinical situations.  eGFR's persistently <60 mL/min signify possible Chronic Kidney  Disease.   Anion gap 5 - 15 8   Comment: Performed at [facility], [address] [place],  [address]  Resulting Agency  [facility]      Specimen Collected: 02/10/18 10:08  Last Resulted: 02/10/18 10:34        

## 2018-05-13 ENCOUNTER — Inpatient Hospital Stay: Payer: Medicare Other | Attending: Oncology

## 2018-05-13 DIAGNOSIS — C61 Malignant neoplasm of prostate: Secondary | ICD-10-CM | POA: Insufficient documentation

## 2018-05-13 LAB — COMPREHENSIVE METABOLIC PANEL
ALT: 14 U/L (ref 0–44)
AST: 18 U/L (ref 15–41)
Albumin: 4.1 g/dL (ref 3.5–5.0)
Alkaline Phosphatase: 69 U/L (ref 38–126)
Anion gap: 8 (ref 5–15)
BUN: 14 mg/dL (ref 8–23)
CO2: 27 mmol/L (ref 22–32)
Calcium: 9.7 mg/dL (ref 8.9–10.3)
Chloride: 102 mmol/L (ref 98–111)
Creatinine, Ser: 0.87 mg/dL (ref 0.61–1.24)
GFR calc Af Amer: >60 mL/min (ref >60)
GFR calc non Af Amer: >60 mL/min (ref >60)
Glucose, Bld: 116 mg/dL — ABNORMAL HIGH (ref 70–99)
Potassium: 3.9 mmol/L (ref 3.5–5.1)
Sodium: 137 mmol/L (ref 135–145)
Total Bilirubin: 0.8 mg/dL (ref 0.3–1.2)
Total Protein: 6.9 g/dL (ref 6.5–8.1)

## 2018-05-13 LAB — CBC WITH DIFFERENTIAL/PLATELET
Abs Immature Granulocytes: 0.1 10*3/uL — ABNORMAL HIGH (ref 0.00–0.07)
Basophils Absolute: 0 10*3/uL (ref 0.0–0.1)
Basophils Relative: 1 %
Eosinophils Absolute: 0.3 10*3/uL (ref 0.0–0.5)
Eosinophils Relative: 6 %
HCT: 42.6 % (ref 39.0–52.0)
Hemoglobin: 13.8 g/dL (ref 13.0–17.0)
Immature Granulocytes: 2 %
Lymphocytes Relative: 12 %
Lymphs Abs: 0.7 10*3/uL (ref 0.7–4.0)
MCH: 29.4 pg (ref 26.0–34.0)
MCHC: 32.4 g/dL (ref 30.0–36.0)
MCV: 90.6 fL (ref 80.0–100.0)
Monocytes Absolute: 0.4 10*3/uL (ref 0.1–1.0)
Monocytes Relative: 7 %
Neutro Abs: 4.2 10*3/uL (ref 1.7–7.7)
Neutrophils Relative %: 72 %
Platelets: 262 10*3/uL (ref 150–400)
RBC: 4.7 MIL/uL (ref 4.22–5.81)
RDW: 12.9 % (ref 11.5–15.5)
WBC: 5.8 10*3/uL (ref 4.0–10.5)
nRBC: 0 % (ref 0.0–0.2)

## 2018-05-13 LAB — PSA: Prostatic Specific Antigen: <0.01 ng/mL (ref 0.00–4.00)

## 2018-08-25 DIAGNOSIS — R001 Bradycardia, unspecified: Secondary | ICD-10-CM | POA: Insufficient documentation

## 2018-09-14 ENCOUNTER — Other Ambulatory Visit: Payer: Self-pay

## 2018-09-15 ENCOUNTER — Other Ambulatory Visit: Payer: Self-pay

## 2018-09-15 ENCOUNTER — Other Ambulatory Visit: Payer: Medicare Other

## 2018-09-15 ENCOUNTER — Inpatient Hospital Stay: Payer: Medicare Other | Attending: Oncology

## 2018-09-15 DIAGNOSIS — C61 Malignant neoplasm of prostate: Secondary | ICD-10-CM | POA: Insufficient documentation

## 2018-09-15 DIAGNOSIS — Z79899 Other long term (current) drug therapy: Secondary | ICD-10-CM | POA: Insufficient documentation

## 2018-09-15 LAB — COMPREHENSIVE METABOLIC PANEL
ALT: 15 U/L (ref 0–44)
AST: 18 U/L (ref 15–41)
Albumin: 3.9 g/dL (ref 3.5–5.0)
Alkaline Phosphatase: 64 U/L (ref 38–126)
Anion gap: 7 (ref 5–15)
BUN: 18 mg/dL (ref 8–23)
CO2: 27 mmol/L (ref 22–32)
Calcium: 8.9 mg/dL (ref 8.9–10.3)
Chloride: 103 mmol/L (ref 98–111)
Creatinine, Ser: 0.96 mg/dL (ref 0.61–1.24)
GFR calc Af Amer: >60 mL/min (ref >60)
GFR calc non Af Amer: >60 mL/min (ref >60)
Glucose, Bld: 207 mg/dL — ABNORMAL HIGH (ref 70–99)
Potassium: 3.9 mmol/L (ref 3.5–5.1)
Sodium: 137 mmol/L (ref 135–145)
Total Bilirubin: 1.1 mg/dL (ref 0.3–1.2)
Total Protein: 6.5 g/dL (ref 6.5–8.1)

## 2018-09-15 LAB — CBC WITH DIFFERENTIAL/PLATELET
Abs Immature Granulocytes: 0.03 10*3/uL (ref 0.00–0.07)
Basophils Absolute: 0 10*3/uL (ref 0.0–0.1)
Basophils Relative: 1 %
Eosinophils Absolute: 0.3 10*3/uL (ref 0.0–0.5)
Eosinophils Relative: 7 %
HCT: 43.1 % (ref 39.0–52.0)
Hemoglobin: 14.3 g/dL (ref 13.0–17.0)
Immature Granulocytes: 1 %
Lymphocytes Relative: 24 %
Lymphs Abs: 1 10*3/uL (ref 0.7–4.0)
MCH: 30.5 pg (ref 26.0–34.0)
MCHC: 33.2 g/dL (ref 30.0–36.0)
MCV: 91.9 fL (ref 80.0–100.0)
Monocytes Absolute: 0.2 10*3/uL (ref 0.1–1.0)
Monocytes Relative: 5 %
Neutro Abs: 2.6 10*3/uL (ref 1.7–7.7)
Neutrophils Relative %: 62 %
Platelets: 223 10*3/uL (ref 150–400)
RBC: 4.69 MIL/uL (ref 4.22–5.81)
RDW: 12.8 % (ref 11.5–15.5)
WBC: 4.1 10*3/uL (ref 4.0–10.5)
nRBC: 0 % (ref 0.0–0.2)

## 2018-09-15 LAB — PSA: Prostatic Specific Antigen: <0.01 ng/mL (ref 0.00–4.00)

## 2018-09-21 ENCOUNTER — Other Ambulatory Visit: Payer: Self-pay

## 2018-09-22 ENCOUNTER — Inpatient Hospital Stay (HOSPITAL_BASED_OUTPATIENT_CLINIC_OR_DEPARTMENT_OTHER): Payer: Medicare Other | Admitting: Oncology

## 2018-09-22 ENCOUNTER — Inpatient Hospital Stay: Payer: Medicare Other | Admitting: Oncology

## 2018-09-22 DIAGNOSIS — C61 Malignant neoplasm of prostate: Secondary | ICD-10-CM

## 2018-09-22 NOTE — Progress Notes (Signed)
Patient telephone visit today for follow up on prostate cancer. Patient has questions regarding lab results.

## 2018-09-22 NOTE — Progress Notes (Signed)
[place]  [phone] [phone]) [phone]  ID: RAJVEER HANDLER OB: [birth year]  MR#: [id number]  LOV#:[id number]  Patient Care Team: Juluis Pitch, MD as PCP - General (Family Medicine)  Virtual Visit via Telephone Note  I connected with [name] on 09/23/18 at  9:30 AM EDT by telephone and verified that I am speaking with the correct person using two identifiers.   I discussed the limitations, risks, security and privacy concerns of performing an evaluation and management service by telephone and the availability of in person appointments. I also discussed with the patient that there may be a patient responsible charge related to this service. The patient expressed understanding and agreed to proceed.   CHIEF COMPLAINT: Prostate cancer  INTERVAL HISTORY: Patient evaluated over the phone to discuss his laboratory work and assess his toleration of Zytiga and prednisone.  He continues to tolerate his treatment well without significant side effects.  He currently feels well and is asymptomatic. He has no neurologic complaints. He denies any recent fevers or illnesses. He has a good appetite and denies weight loss.  He denies any chest pain, shortness of breath, cough, or hemoptysis. He denies any nausea, vomiting, constipation, or diarrhea. He has no urinary complaints.  Patient feels at his baseline offers no specific complaints today.  REVIEW OF SYSTEMS:   Review of Systems  Constitutional: Negative.  Negative for fever, malaise/fatigue and weight loss.  Respiratory: Negative.  Negative for cough and shortness of breath.   Cardiovascular: Negative.  Negative for chest pain and leg swelling.  Gastrointestinal: Negative.  Negative for abdominal pain, nausea and vomiting.  Genitourinary: Negative.  Negative for frequency.  Musculoskeletal: Negative.  Negative for back pain.  Skin: Negative.  Negative for rash.  Neurological: Negative.  Negative  for focal weakness, weakness and headaches.  Endo/Heme/Allergies: Does not bruise/bleed easily.  Psychiatric/Behavioral: Negative.  The patient is not nervous/anxious.     As per HPI. Otherwise, a complete review of systems is negative.  PAST MEDICAL HISTORY: Past Medical History:  Diagnosis Date  . High cholesterol   . Prostate cancer ([place])     PAST SURGICAL HISTORY: Prostatectomy.  FAMILY HISTORY: Reviewed and unchanged. No reported history of malignancy or chronic disease.     ADVANCED DIRECTIVES:    HEALTH MAINTENANCE: Social History   Tobacco Use  . Smoking status: Former Smoker    Types: Cigarettes    Last attempt to quit: 06/24/1972    Years since quitting: 46.2  . Smokeless tobacco: Never Used  Substance Use Topics  . Alcohol use: No  . Drug use: No     Colonoscopy:  PAP:  Bone density:  Lipid panel:  No Known Allergies  Current Outpatient Medications  Medication Sig Dispense Refill  . abiraterone acetate (ZYTIGA) 250 MG tablet Take 4 tablets (1,000 mg total) by mouth daily. Take on an empty stomach 1 hour before or 2 hours after a meal 120 tablet 5  . atorvastatin (LIPITOR) 20 MG tablet TAKE 1/2 TABLET ONCE DAILY    . calcium-vitamin D (SM CALCIUM 500/VITAMIN D3) 500-400 MG-UNIT per tablet Take by mouth 2 (two) times daily.     . Cholecalciferol (VITAMIN D) 2000 units CAPS Take by mouth.    . diltiazem (CARDIZEM CD) 180 MG 24 hr capsule Take 180 mg by mouth daily.     . predniSONE (DELTASONE) 5 MG tablet Take 0.5 tablets (2.5 mg total) by mouth every 12 (twelve) hours. 90 tablet 1  No current facility-administered medications for this visit.     OBJECTIVE: There were no vitals filed for this visit.   There is no height or weight on file to calculate BMI.    ECOG FS:0 - Asymptomatic  LAB RESULTS:  Lab Results  Component Value Date   NA 137 09/15/2018   K 3.9 09/15/2018   CL 103 09/15/2018   CO2 27 09/15/2018   GLUCOSE 207 (H) 09/15/2018   BUN  18 09/15/2018   CREATININE 0.96 09/15/2018   CALCIUM 8.9 09/15/2018   PROT 6.5 09/15/2018   ALBUMIN 3.9 09/15/2018   AST 18 09/15/2018   ALT 15 09/15/2018   ALKPHOS 64 09/15/2018   BILITOT 1.1 09/15/2018   GFRNONAA >60 09/15/2018   GFRAA >60 09/15/2018    Lab Results  Component Value Date   WBC 4.1 09/15/2018   NEUTROABS 2.6 09/15/2018   HGB 14.3 09/15/2018   HCT 43.1 09/15/2018   MCV 91.9 09/15/2018   PLT 223 09/15/2018   Lab Results  Component Value Date   PSA <0.01 10/29/2016     STUDIES: No results found.  ASSESSMENT: Prostate cancer  PLAN:    1. Prostate cancer: No evidence of disease.  Continue Zytiga and 2.5 mg of prednisone twice per day.  Patient's PSA continues to be undetectable at less than 0.01.  Patient's PSA has been undetectable since at least 2013.  We again discussed with patient the possibility of discontinuing treatment, but he is not interested at this time.  Return to clinic in 3 months for laboratory work and further evaluation. 2. Elevated bilirubin: Resolved.  Patient's bilirubin is within normal limits today. 3.  DEXA scan: Because of the patient's chronic use of low-dose prednisone a bone marrow density was ordered.  Bone mineral density on September 12, 2016 reported T score of -0.9 which is normal.  Repeat in the next 1 to 2 months. 4.  Elevated blood glucose: Possibly secondary to prednisone, monitor.   I discussed the assessment and treatment plan with the patient. The patient was provided an opportunity to ask questions and all were answered. The patient agreed with the plan and demonstrated an understanding of the instructions.   The patient was advised to call back or seek an in-person evaluation if the symptoms worsen or if the condition fails to improve as anticipated.  I provided 25 minutes of non-face-to-face time during this encounter.   [name], MD   Patient expressed understanding and was in agreement with this plan.  He also understands that He can call clinic at any time with any questions, concerns, or complaints.   Cancer Staging Cancer of prostate [facility]) Staging form: Prostate, AJCC 7th Edition - Clinical: Stage IV (M1, Gleason <= 6 - Well differentiated (slight anaplasia)) - Signed by Evlyn Kanner, NP on 04/19/2015   [name], MD   09/23/2018 2:16 PM

## 2018-10-19 ENCOUNTER — Other Ambulatory Visit: Payer: Medicare Other

## 2018-10-22 ENCOUNTER — Other Ambulatory Visit: Payer: Self-pay | Admitting: *Deleted

## 2018-10-22 DIAGNOSIS — C61 Malignant neoplasm of prostate: Secondary | ICD-10-CM

## 2018-10-22 MED ORDER — ABIRATERONE ACETATE 250 MG PO TABS: 1000.0000 mg | ORAL_TABLET | Freq: Every day | ORAL | 5 refills | Status: DC

## 2018-10-22 NOTE — Telephone Encounter (Signed)
CBC with Differential/Platelet  Order: [id number]  Status:  Final result  Visible to patient:  Yes (MyChart)  Next appt:  11/27/2018 at 10:00 AM in Radiology ([facility] DEXA 1)  Dx:  Cancer of prostate ([place])   Ref Range & Units 41mo ago (09/15/18) 4mo ago (05/13/18) 21mo ago (02/10/18) 43mo ago (11/11/17) 65yr ago (08/05/17) 13yr ago (05/06/17) 32yr ago (02/04/17)  WBC 4.0 - 10.5 K/uL 4.1  5.8  4.8 R 5.2 R 5.6 R 4.5 R 5.5 R  RBC 4.22 - 5.81 MIL/uL 4.69  4.70  4.59 R 4.47 R 4.44 R 4.87 R 4.46 R  Hemoglobin 13.0 - 17.0 g/dL 14.3  13.8  14.1 R 14.0 R 13.7 R 14.8 R 13.7 R  HCT 39.0 - 52.0 % 43.1  42.6  41.9 R 40.4 R 40.3 R 44.0 R 39.3Low  R  MCV 80.0 - 100.0 fL 91.9  90.6  91.1  90.4  90.8  90.3  88.2   MCH 26.0 - 34.0 pg 30.5  29.4  30.8  31.3  30.8  30.4  30.6   MCHC 30.0 - 36.0 g/dL 33.2  32.4  33.8 R 34.6 R 33.9 R 33.7 R 34.7 R  RDW 11.5 - 15.5 % 12.8  12.9  14.1 R 13.5 R 14.0 R 13.9 R 13.7 R  Platelets 150 - 400 K/uL 223  262  217 R 221 R 222 R 240 R 222 R  nRBC 0.0 - 0.2 % 0.0  0.0        Neutrophils Relative % % 62  72  68  65  75  68  68   Neutro Abs 1.7 - 7.7 K/uL 2.6  4.2  3.3 R 3.4 R 4.3 R 3.1 R 3.8 R  Lymphocytes Relative % 24  12  19  20  12  18  18    Lymphs Abs 0.7 - 4.0 K/uL 1.0  0.7  0.9Low  R 1.0 R 0.7Low  R 0.8Low  R 1.0 R  Monocytes Relative % 5  7  5  7  7  7  7    Monocytes Absolute 0.1 - 1.0 K/uL 0.2  0.4  0.3 R 0.4 R 0.4 R 0.3 R 0.4 R  Eosinophils Relative % 7  6  7  7  5  6  6    Eosinophils Absolute 0.0 - 0.5 K/uL 0.3  0.3  0.3 R 0.4 R 0.3 R 0.3 R 0.3 R  Basophils Relative % 1  1  1  1  1  1  1    Basophils Absolute 0.0 - 0.1 K/uL 0.0  0.0  0.0 R, CM 0.1 R, CM 0.0 R, CM 0.0 R 0.1 R  Immature Granulocytes % 1  2        Abs Immature Granulocytes 0.00 - 0.07 K/uL 0.03  0.10High  CM       Comment: Performed at [facility], [address] [place], [place] [address]  Resulting Agency  [facility] [place] [facility] [place] [facility] [place] [facility] [place] [facility] [place] [facility]       Specimen Collected: 09/15/18 09:29  Last Resulted: 09/15/18 09:41     Lab Flowsheet    Order Details    View Encounter    Lab and Collection Details    Routing    Result History      CM=Additional commentsR=Reference range differs from displayed range      Other Results from  09/15/2018   PSA  Order: [id number]   Status:  Final result  Visible to patient:  Yes (MyChart)  Next appt:  11/27/2018 at 10:00 AM in Radiology ([facility] D[facility])  Dx:  Cancer of prostate ([place])   Ref Range & Units 44mo ago (09/15/18) 70mo ago (05/13/18) 65mo ago (02/10/18) 71mo ago (11/11/17) 70yr ago (08/05/17) 31yr ago (05/06/17) 66yr ago (02/04/17)  Prostatic Specific Antigen 0.00 - 4.00 ng/mL <0.01  <0.01 CM <0.01 CM <0.01 CM <0.01 CM <0.01 CM <0.01 CM  Comment: REPEATED TO VERIFY  Performed at [facility], [address], [place], [place] [address]   Resulting Agency  [place] [facility] [place] [facility] [place] [facility] [place] [facility] [place] [facility] [place] [facility]      Specimen Collected: 09/15/18 09:29  Last Resulted: 09/15/18 20:58     Lab Flowsheet    Order Details    View Encounter    Lab and Collection Details    Routing    Result History      CM=Additional comments        Contains abnormal data Comprehensive metabolic panel  Order: [id number]   Status:  Final result  Visible to patient:  Yes (MyChart)  Next appt:  11/27/2018 at 10:00 AM in Radiology ([facility])  Dx:  Cancer of prostate ([place])   Ref Range & Units 8mo ago (09/15/18) 71mo ago (05/13/18) 75mo ago (02/10/18) 75mo ago (11/11/17) 29yr ago (08/05/17) 60yr ago (05/06/17) 82yr ago (02/04/17)  Sodium 135 - 145 mmol/L 137  137  140  138  137  137  137   Potassium 3.5 - 5.1 mmol/L 3.9  3.9  3.9  4.5  4.1  3.8  4.0   Chloride 98 - 111 mmol/L 103  102  102  102 R 102 R 103 R 101 R  CO2 22 - 32 mmol/L 27  27  30  27  29  28  27    Glucose, Bld 70 - 99 mg/dL 207High   116High   183High   105High  R 106High  R 98 R  133High  R  BUN 8 - 23 mg/dL 18  14  16  16  R 16 R 16 R 15 R  Creatinine, Ser 0.61 - 1.24 mg/dL 0.96  0.87  0.87  1.14  1.01  0.68  0.87   Calcium 8.9 - 10.3 mg/dL 8.9  9.7  9.4  9.4  9.1  9.4  9.2   Total Protein 6.5 - 8.1 g/dL 6.5  6.9  6.6  6.9  6.4Low   7.0  6.5   Albumin 3.5 - 5.0 g/dL 3.9  4.1  4.1  3.9  3.9  4.2  4.0   AST 15 - 41 U/L 18  18  15  19  17  18  18    ALT 0 - 44 U/L 15  14  12   13Low  R 14Low  R 13Low  R 11Low  R  Alkaline Phosphatase 38 - 126 U/L 64  69  71  64  57  65  60   Total Bilirubin 0.3 - 1.2 mg/dL 1.1  0.8  1.5High   1.0  1.3High   1.0  1.4High    GFR calc non Af Amer >60 mL/min >60  >60  >60  >60  >60  >60  >60   GFR calc Af Amer >60 mL/min >60  >60 CM >60 CM >60 CM >  60 CM >60 CM >60 CM  Anion gap 5 - 15 7  8  CM 8 CM 9 CM 6 CM 6  9   Comment: Performed at [facility], [address] [place], [place] [address]  Resulting Agency  [facility] [place] [facility] [place] [facility] [place] [facility] [place] [facility] [place] [facility] [place] [facility]      Specimen Collected: 09/15/18 09:29  Last Resulted: 09/15/18 09:52

## 2018-11-27 ENCOUNTER — Ambulatory Visit
Admission: RE | Admit: 2018-11-27 | Discharge: 2018-11-27 | Disposition: A | Discharge status: Home / Self Care | Payer: Medicare Other | Source: Ambulatory Visit | Attending: Oncology | Admitting: Oncology

## 2018-11-27 ENCOUNTER — Other Ambulatory Visit: Payer: Self-pay

## 2018-11-27 DIAGNOSIS — Z7952 Long term (current) use of systemic steroids: Secondary | ICD-10-CM | POA: Diagnosis not present

## 2018-11-27 DIAGNOSIS — C61 Malignant neoplasm of prostate: Secondary | ICD-10-CM | POA: Insufficient documentation

## 2018-12-08 ENCOUNTER — Other Ambulatory Visit: Payer: Self-pay | Admitting: Oncology

## 2018-12-08 DIAGNOSIS — C61 Malignant neoplasm of prostate: Secondary | ICD-10-CM

## 2018-12-22 ENCOUNTER — Other Ambulatory Visit: Payer: Self-pay

## 2018-12-22 ENCOUNTER — Inpatient Hospital Stay: Payer: Medicare Other | Attending: Oncology

## 2018-12-22 DIAGNOSIS — C61 Malignant neoplasm of prostate: Secondary | ICD-10-CM | POA: Insufficient documentation

## 2018-12-22 LAB — COMPREHENSIVE METABOLIC PANEL
ALT: 12 U/L (ref 0–44)
AST: 13 U/L — ABNORMAL LOW (ref 15–41)
Albumin: 3.9 g/dL (ref 3.5–5.0)
Alkaline Phosphatase: 56 U/L (ref 38–126)
Anion gap: 8 (ref 5–15)
BUN: 15 mg/dL (ref 8–23)
CO2: 25 mmol/L (ref 22–32)
Calcium: 8.7 mg/dL — ABNORMAL LOW (ref 8.9–10.3)
Chloride: 105 mmol/L (ref 98–111)
Creatinine, Ser: 0.82 mg/dL (ref 0.61–1.24)
GFR calc Af Amer: >60 mL/min (ref >60)
GFR calc non Af Amer: >60 mL/min (ref >60)
Glucose, Bld: 104 mg/dL — ABNORMAL HIGH (ref 70–99)
Potassium: 3.7 mmol/L (ref 3.5–5.1)
Sodium: 138 mmol/L (ref 135–145)
Total Bilirubin: 1 mg/dL (ref 0.3–1.2)
Total Protein: 6.4 g/dL — ABNORMAL LOW (ref 6.5–8.1)

## 2018-12-22 LAB — CBC WITH DIFFERENTIAL/PLATELET
Abs Immature Granulocytes: 0.05 10*3/uL (ref 0.00–0.07)
Basophils Absolute: 0.1 10*3/uL (ref 0.0–0.1)
Basophils Relative: 1 %
Eosinophils Absolute: 0.4 10*3/uL (ref 0.0–0.5)
Eosinophils Relative: 7 %
HCT: 40.2 % (ref 39.0–52.0)
Hemoglobin: 13.2 g/dL (ref 13.0–17.0)
Immature Granulocytes: 1 %
Lymphocytes Relative: 17 %
Lymphs Abs: 0.8 10*3/uL (ref 0.7–4.0)
MCH: 29.7 pg (ref 26.0–34.0)
MCHC: 32.8 g/dL (ref 30.0–36.0)
MCV: 90.5 fL (ref 80.0–100.0)
Monocytes Absolute: 0.5 10*3/uL (ref 0.1–1.0)
Monocytes Relative: 9 %
Neutro Abs: 3.1 10*3/uL (ref 1.7–7.7)
Neutrophils Relative %: 65 %
Platelets: 218 10*3/uL (ref 150–400)
RBC: 4.44 MIL/uL (ref 4.22–5.81)
RDW: 12.9 % (ref 11.5–15.5)
WBC: 4.8 10*3/uL (ref 4.0–10.5)
nRBC: 0 % (ref 0.0–0.2)

## 2018-12-26 NOTE — Progress Notes (Signed)
[place]  Telephone:(336) [phone] Fax:(336) [phone]  ID: Brandon Shelton OB: June 15, 1946  MR#: [id number]  QVZ#:[id number]  Patient Care Team: Brandon Pitch, MD as PCP - General (Family Medicine)   CHIEF COMPLAINT: Prostate cancer  INTERVAL HISTORY: Patient returns to clinic today for laboratory work and routine evaluation.  He continues to tolerate Zytiga and prednisone without significant side effects.  He currently feels well and is asymptomatic.  He has no neurologic complaints. He denies any recent fevers or illnesses. He has a good appetite and denies weight loss.  He denies any chest pain, shortness of breath, cough, or hemoptysis. He denies any nausea, vomiting, constipation, or diarrhea. He has no urinary complaints.  Patient feels at his baseline offers no specific complaints today.  REVIEW OF SYSTEMS:   Review of Systems  Constitutional: Negative.  Negative for fever, malaise/fatigue and weight loss.  Respiratory: Negative.  Negative for cough and shortness of breath.   Cardiovascular: Negative.  Negative for chest pain and leg swelling.  Gastrointestinal: Negative.  Negative for abdominal pain, nausea and vomiting.  Genitourinary: Negative.  Negative for frequency.  Musculoskeletal: Negative.  Negative for back pain.  Skin: Negative.  Negative for rash.  Neurological: Negative.  Negative for focal weakness, weakness and headaches.  Endo/Heme/Allergies: Does not bruise/bleed easily.  Psychiatric/Behavioral: Negative.  The patient is not nervous/anxious.     As per HPI. Otherwise, a complete review of systems is negative.  PAST MEDICAL HISTORY: Past Medical History:  Diagnosis Date  . High cholesterol   . Prostate cancer ([place])     PAST SURGICAL HISTORY: Prostatectomy.  FAMILY HISTORY: Reviewed and unchanged. No reported history of malignancy or chronic disease.     ADVANCED DIRECTIVES:    HEALTH MAINTENANCE: Social History   Tobacco  Use  . Smoking status: Former Smoker    Types: Cigarettes    Quit date: 06/24/1972    Years since quitting: 46.5  . Smokeless tobacco: Never Used  Substance Use Topics  . Alcohol use: No  . Drug use: No     Colonoscopy:  PAP:  Bone density:  Lipid panel:  No Known Allergies  Current Outpatient Medications  Medication Sig Dispense Refill  . abiraterone acetate (ZYTIGA) 250 MG tablet Take 4 tablets (1,000 mg total) by mouth daily. Take on an empty stomach 1 hour before or 2 hours after a meal 120 tablet 5  . atorvastatin (LIPITOR) 20 MG tablet TAKE 1/2 TABLET ONCE DAILY    . calcium-vitamin D (SM CALCIUM 500/VITAMIN D3) 500-400 MG-UNIT per tablet Take by mouth 2 (two) times daily.     Marland Kitchen diltiazem (CARDIZEM CD) 180 MG 24 hr capsule Take 180 mg by mouth daily.     . predniSONE (DELTASONE) 5 MG tablet Take 0.5 tablets (2.5 mg total) by mouth every 12 (twelve) hours. 90 tablet 0   No current facility-administered medications for this visit.     OBJECTIVE: Vitals:   12/29/18 1431  BP: 133/71  Pulse: 71  Temp: (!) 96.7 F (35.9 C)     Body mass index is 26.15 kg/m.    ECOG FS:0 - Asymptomatic  LAB RESULTS:  Lab Results  Component Value Date   NA 138 12/22/2018   K 3.7 12/22/2018   CL 105 12/22/2018   CO2 25 12/22/2018   GLUCOSE 104 (H) 12/22/2018   BUN 15 12/22/2018   CREATININE 0.82 12/22/2018   CALCIUM 8.7 (L) 12/22/2018   PROT 6.4 (L) 12/22/2018   ALBUMIN  3.9 12/22/2018   AST 13 (L) 12/22/2018   ALT 12 12/22/2018   ALKPHOS 56 12/22/2018   BILITOT 1.0 12/22/2018   GFRNONAA >60 12/22/2018   GFRAA >60 12/22/2018    Lab Results  Component Value Date   WBC 4.8 12/22/2018   NEUTROABS 3.1 12/22/2018   HGB 13.2 12/22/2018   HCT 40.2 12/22/2018   MCV 90.5 12/22/2018   PLT 218 12/22/2018   Lab Results  Component Value Date   PSA <0.01 10/29/2016     STUDIES: No results found.  ASSESSMENT: Prostate cancer  PLAN:    1. Prostate cancer: No evidence of  disease.  Continue Zytiga and 2.5 mg of prednisone twice per day.  Patient's PSA continues to be undetectable at less than 0.1.  Patient's PSA has been undetectable since at least 2013.  We again discussed with patient the possibility of discontinuing treatment, but he is not interested at this time.  Return to clinic in 3 months for laboratory work only and then in 6 months for laboratory work and further evaluation.   2. Elevated bilirubin: Resolved.  Patient's bilirubin is within normal limits today. 3.  DEXA scan: Patient is at risk for osteoporosis secondary to chronic use of low-dose prednisone.  His most recent bone mineral density on November 27, 2018 reported T score of -0.9 which is unchanged from 2 years prior and considered normal.  Repeat in June 2022.   4.  Elevated blood glucose: Mild, monitor.  I spent a total of 30 minutes face-to-face with the patient of which greater than 50% of the visit was spent in counseling and coordination of care as detailed above.   Patient expressed understanding and was in agreement with this plan. He also understands that He can call clinic at any time with any questions, concerns, or complaints.   Cancer Staging Cancer of prostate [facility]) Staging form: Prostate, AJCC 7th Edition - Clinical: Stage IV (M1, Gleason <= 6 - Well differentiated (slight anaplasia)) - Signed by Brandon Kanner, NP on 04/19/2015   Brandon Huger, MD   01/01/2019 9:27 AM

## 2018-12-29 ENCOUNTER — Other Ambulatory Visit: Payer: Self-pay

## 2018-12-29 ENCOUNTER — Inpatient Hospital Stay: Payer: Medicare Other | Attending: Oncology | Admitting: Oncology

## 2018-12-29 ENCOUNTER — Inpatient Hospital Stay: Payer: Medicare Other

## 2018-12-29 ENCOUNTER — Encounter: Payer: Self-pay | Admitting: Oncology

## 2018-12-29 ENCOUNTER — Telehealth: Payer: Self-pay | Admitting: Pharmacy Technician

## 2018-12-29 VITALS — BP 133/71 | HR 71 | Temp 96.7°F | Ht 66.0 in | Wt 162.0 lb

## 2018-12-29 DIAGNOSIS — Z87891 Personal history of nicotine dependence: Secondary | ICD-10-CM | POA: Diagnosis not present

## 2018-12-29 DIAGNOSIS — C61 Malignant neoplasm of prostate: Secondary | ICD-10-CM

## 2018-12-29 DIAGNOSIS — R7309 Other abnormal glucose: Secondary | ICD-10-CM

## 2018-12-29 NOTE — Telephone Encounter (Signed)
Oral Oncology Patient Advocate Encounter   Received a fax from Patient [place] [facility]) that patient has been approved for a $[id number] grant from the [place].  This will provide coverage for the copay of his Zytiga.     The billing information is as follows:  Member ID: [id number] Group ID: [id number] RxBin: [id number]   Dates of Eligibility: 01/27/2019 through 01/26/2020  [place] Patient [place] Phone [phone] Fax [phone] 12/29/2018 8:14 AM

## 2018-12-29 NOTE — Progress Notes (Signed)
Patient stated that he had been doing well with no complaints. Patient stated that he saw that his Calcium was low and wanted to know if there was something else he could do to increase his level.

## 2018-12-31 LAB — PROSTATE-SPECIFIC AG, SERUM (LABCORP): Prostate Specific Ag, Serum: <0.1 ng/mL (ref 0.0–4.0)

## 2019-02-11 ENCOUNTER — Other Ambulatory Visit: Payer: Self-pay

## 2019-02-11 ENCOUNTER — Other Ambulatory Visit
Admission: RE | Admit: 2019-02-11 | Discharge: 2019-02-11 | Disposition: A | Discharge status: Home / Self Care | Payer: Medicare Other | Source: Ambulatory Visit | Attending: Internal Medicine | Admitting: Internal Medicine

## 2019-02-11 DIAGNOSIS — Z20822 Contact with and (suspected) exposure to covid-19: Secondary | ICD-10-CM

## 2019-02-11 DIAGNOSIS — Z20828 Contact with and (suspected) exposure to other viral communicable diseases: Secondary | ICD-10-CM | POA: Insufficient documentation

## 2019-02-11 DIAGNOSIS — Z01812 Encounter for preprocedural laboratory examination: Secondary | ICD-10-CM | POA: Insufficient documentation

## 2019-02-11 LAB — SARS CORONAVIRUS 2 (TAT 6-24 HRS): SARS Coronavirus 2: NEGATIVE

## 2019-02-15 ENCOUNTER — Other Ambulatory Visit: Payer: Self-pay

## 2019-02-15 ENCOUNTER — Ambulatory Visit: Payer: Medicare Other | Admitting: Certified Registered"

## 2019-02-15 ENCOUNTER — Encounter
Admission: RE | Disposition: A | Discharge status: Home / Self Care | Payer: Self-pay | Source: Home / Self Care | Attending: Internal Medicine

## 2019-02-15 ENCOUNTER — Ambulatory Visit
Admission: RE | Admit: 2019-02-15 | Discharge: 2019-02-15 | Disposition: A | Discharge status: Home / Self Care | Payer: Medicare Other | Attending: Internal Medicine | Admitting: Internal Medicine

## 2019-02-15 DIAGNOSIS — Z86010 Personal history of colonic polyps: Secondary | ICD-10-CM | POA: Insufficient documentation

## 2019-02-15 DIAGNOSIS — E119 Type 2 diabetes mellitus without complications: Secondary | ICD-10-CM | POA: Diagnosis not present

## 2019-02-15 DIAGNOSIS — K5730 Diverticulosis of large intestine without perforation or abscess without bleeding: Secondary | ICD-10-CM | POA: Diagnosis not present

## 2019-02-15 DIAGNOSIS — Z1211 Encounter for screening for malignant neoplasm of colon: Secondary | ICD-10-CM | POA: Insufficient documentation

## 2019-02-15 DIAGNOSIS — K644 Residual hemorrhoidal skin tags: Secondary | ICD-10-CM | POA: Diagnosis not present

## 2019-02-15 DIAGNOSIS — Z8546 Personal history of malignant neoplasm of prostate: Secondary | ICD-10-CM | POA: Insufficient documentation

## 2019-02-15 DIAGNOSIS — I471 Supraventricular tachycardia: Secondary | ICD-10-CM | POA: Insufficient documentation

## 2019-02-15 DIAGNOSIS — D122 Benign neoplasm of ascending colon: Secondary | ICD-10-CM | POA: Diagnosis not present

## 2019-02-15 DIAGNOSIS — M8580 Other specified disorders of bone density and structure, unspecified site: Secondary | ICD-10-CM | POA: Diagnosis not present

## 2019-02-15 DIAGNOSIS — Z79899 Other long term (current) drug therapy: Secondary | ICD-10-CM | POA: Diagnosis not present

## 2019-02-15 DIAGNOSIS — E7800 Pure hypercholesterolemia, unspecified: Secondary | ICD-10-CM | POA: Diagnosis not present

## 2019-02-15 DIAGNOSIS — K641 Second degree hemorrhoids: Secondary | ICD-10-CM | POA: Insufficient documentation

## 2019-02-15 DIAGNOSIS — K621 Rectal polyp: Secondary | ICD-10-CM | POA: Insufficient documentation

## 2019-02-15 DIAGNOSIS — R001 Bradycardia, unspecified: Secondary | ICD-10-CM | POA: Insufficient documentation

## 2019-02-15 DIAGNOSIS — D120 Benign neoplasm of cecum: Secondary | ICD-10-CM | POA: Insufficient documentation

## 2019-02-15 HISTORY — DX: Supraventricular tachycardia: I47.1

## 2019-02-15 HISTORY — PX: COLONOSCOPY WITH PROPOFOL: SHX5780

## 2019-02-15 HISTORY — DX: Palpitations: R00.2

## 2019-02-15 HISTORY — DX: Other specified disorders of bone density and structure, unspecified site: M85.80

## 2019-02-15 HISTORY — DX: Bradycardia, unspecified: R00.1

## 2019-02-15 HISTORY — DX: Type 2 diabetes mellitus without complications: E11.9

## 2019-02-15 HISTORY — DX: Supraventricular tachycardia, unspecified: I47.10

## 2019-02-15 SURGERY — COLONOSCOPY WITH PROPOFOL
Anesthesia: General

## 2019-02-15 MED ORDER — PROPOFOL 10 MG/ML IV BOLUS
INTRAVENOUS | Status: DC | PRN
  Administered 2019-02-15 (×3): 20 mg via INTRAVENOUS
  Administered 2019-02-15: 80 mg via INTRAVENOUS

## 2019-02-15 MED ORDER — PROPOFOL 10 MG/ML IV BOLUS
INTRAVENOUS | Status: AC
  Filled 2019-02-15: qty 20

## 2019-02-15 MED ORDER — SODIUM CHLORIDE 0.9 % IV SOLN
INTRAVENOUS | Status: DC
  Administered 2019-02-15: 09:00:00 via INTRAVENOUS

## 2019-02-15 NOTE — Anesthesia Preprocedure Evaluation (Addendum)
Anesthesia Evaluation  Patient identified by MRN, date of birth, ID band Patient awake    Reviewed: Allergy & Precautions, H&P , NPO status , Patient's Chart, lab work & pertinent test results  Airway Mallampati: III  TM Distance: >3 FB     Dental  (+) Chipped   Pulmonary neg pulmonary ROS, neg shortness of breath, neg COPD, neg recent URI, former smoker,           Cardiovascular hypertension, (-) Past MI and (-) Cardiac Stents + dysrhythmias Supra Ventricular Tachycardia      Neuro/Psych negative neurological ROS  negative psych ROS   GI/Hepatic negative GI ROS, Neg liver ROS,   Endo/Other  diabetes  Renal/GU negative Renal ROS  negative genitourinary   Musculoskeletal   Abdominal   Peds  Hematology negative hematology ROS (+)   Anesthesia Other Findings Past Medical History: No date: Bradycardia No date: Diabetes mellitus without complication (HCC) No date: High cholesterol No date: Osteopenia No date: Palpitations No date: Paroxysmal supraventricular tachycardia (HCC) No date: Prostate cancer (HCC)     Comment:  history of prostate cancer  Past Surgical History: 1990's: COLON SURGERY 10/15/1991, 10/20/2001, 08/21/2005, 09/14/2008, 10/04/2013: COLONOSCOPY W/  POLYPECTOMY     Comment:  adenomatous polyps 02/2004: LAPAROSCOPIC RETROPUBIC PROSTATECTOMY No date: STAPLE HEMORRHOIDECTOMY  BMI    Body Mass Index: 25.02 kg/m      Reproductive/Obstetrics negative OB ROS                            Anesthesia Physical Anesthesia Plan  ASA: II  Anesthesia Plan: General   Post-op Pain Management:    Induction:   PONV Risk Score and Plan: Propofol infusion and TIVA  Airway Management Planned: Natural Airway and Nasal Cannula  Additional Equipment:   Intra-op Plan:   Post-operative Plan:   Informed Consent: I have reviewed the patients History and Physical, chart, labs and  discussed the procedure including the risks, benefits and alternatives for the proposed anesthesia with the patient or authorized representative who has indicated his/her understanding and acceptance.     Dental Advisory Given  Plan Discussed with: Anesthesiologist and CRNA  Anesthesia Plan Comments:         Anesthesia Quick Evaluation

## 2019-02-15 NOTE — H&P (Signed)
  Outpatient short stay form Pre-procedure 02/15/2019 8:58 AM Teodoro K. Alice Reichert, M.D.  Primary Physician: Juluis Pitch, M.D.  Reason for visit:  Personal hx of tubular adenomatous polyps  History of present illness:                            Patient presents for colonoscopy for a personal hx of colon polyps. The patient denies abdominal pain, abnormal weight loss or rectal bleeding.     Current Facility-Administered Medications:  .  0.9 %  sodium chloride infusion, , Intravenous, Continuous, [place], Benay Pike, MD, Last Rate: 20 mL/hr at 02/15/19 [id number]  Medications Prior to Admission  Medication Sig Dispense Refill Last Dose  . abiraterone acetate (ZYTIGA) 250 MG tablet Take 4 tablets (1,000 mg total) by mouth daily. Take on an empty stomach 1 hour before or 2 hours after a meal 120 tablet 5 02/13/2019  . atorvastatin (LIPITOR) 20 MG tablet TAKE 1/2 TABLET ONCE DAILY   02/13/2019  . calcium-vitamin D (SM CALCIUM 500/VITAMIN D3) 500-400 MG-UNIT per tablet Take by mouth 2 (two) times daily.    02/13/2019  . diltiazem (CARDIZEM CD) 180 MG 24 hr capsule Take 180 mg by mouth daily.    02/15/2019  . predniSONE (DELTASONE) 5 MG tablet Take 0.5 tablets (2.5 mg total) by mouth every 12 (twelve) hours. 90 tablet 0 02/13/2019     No Known Allergies   Past Medical History:  Diagnosis Date  . Bradycardia   . Diabetes mellitus without complication ([place])   . High cholesterol   . Osteopenia   . Palpitations   . Paroxysmal supraventricular tachycardia ([place])   . Prostate cancer [facility])    history of prostate cancer    Review of systems:  Otherwise negative.    Physical Exam  Gen: Alert, oriented. Appears stated age.  HEENT: [place]/AT. PERRLA. Lungs: CTA, no wheezes. CV: RR nl S1, S2. Abd: soft, benign, no masses. BS+ Ext: No edema. Pulses 2+    Planned procedures: Proceed with colonoscopy. The patient understands the nature of the planned procedure, indications, risks, alternatives and  potential complications including but not limited to bleeding, infection, perforation, damage to internal organs and possible oversedation/side effects from anesthesia. The patient agrees and gives consent to proceed.  Please refer to procedure notes for findings, recommendations and patient disposition/instructions.     Teodoro K. Alice Reichert, M.D. Gastroenterology 02/15/2019  8:58 AM

## 2019-02-15 NOTE — Op Note (Signed)
[facility] Gastroenterology Patient Name: Brandon Shelton Procedure Date: 02/15/2019 8:42 AM MRN: [id number] Account #: [id number] Date of Birth: Nov 22, 1945 Admit Type: Outpatient Age: 73 Room: [facility] ENDO ROOM 1 Gender: Male Note Status: Finalized Procedure:            Colonoscopy Indications:          Surveillance: Personal history of adenomatous polyps on                        last colonoscopy > 5 years ago Providers:            Lorie Apley K. Hazaiah Edgecombe MD, MD Medicines:            Propofol per Anesthesia Complications:        No immediate complications. Procedure:            Pre-Anesthesia Assessment:                       - The risks and benefits of the procedure and the                        sedation options and risks were discussed with the                        patient. All questions were answered and informed                        consent was obtained.                       - Patient identification and proposed procedure were                        verified prior to the procedure by the nurse. The                        procedure was verified in the procedure room.                       - ASA Grade Assessment: III - A patient with severe                        systemic disease.                       - After reviewing the risks and benefits, the patient                        was deemed in satisfactory condition to undergo the                        procedure.                       After obtaining informed consent, the colonoscope was                        passed under direct vision. Throughout the procedure,                        the patient's blood pressure, pulse, and oxygen  saturations were monitored continuously. The                        Colonoscope was introduced through the anus and                        advanced to the the cecum, identified by appendiceal                        orifice and ileocecal valve. The colonoscopy  was                        performed without difficulty. The patient tolerated the                        procedure well. The quality of the bowel preparation                        was good. The ileocecal valve, appendiceal orifice, and                        rectum were photographed. Findings:      The perianal exam findings include internal hemorrhoids that prolapse       with straining, but spontaneously regress to the resting position (Grade       II).      Non-bleeding internal hemorrhoids were found during retroflexion. The       hemorrhoids were Grade I (internal hemorrhoids that do not prolapse).      Many small and large-mouthed diverticula were found in the entire colon.      Two semi-pedunculated polyps were found in the rectum and ascending       colon. The polyps were 5 to 7 mm in size. These polyps were removed with       a hot snare. Resection and retrieval were complete.      A 5 mm polyp was found in the cecum. The polyp was sessile. The polyp       was removed with a cold snare. Resection and retrieval were complete.      A 5 mm polyp was found in the proximal ascending colon. The polyp was       sessile. The polyp was removed with a cold snare. Polyp resection was       incomplete, and the resected tissue was not retrieved.      The exam was otherwise without abnormality. Impression:           - Internal hemorrhoids that prolapse with straining,                        but spontaneously regress to the resting position                        (Grade II) found on perianal exam.                       - Non-bleeding internal hemorrhoids.                       - Diverticulosis in the entire examined colon.                       -  Two 5 to 7 mm polyps in the rectum and in the                        ascending colon, removed with a hot snare. Resected and                        retrieved.                       - One 5 mm polyp in the cecum, removed with a cold                         snare. Resected and retrieved.                       - One 5 mm polyp in the proximal ascending colon,                        removed with a cold snare. Incomplete resection.                        Resected tissue not retrieved.                       - The examination was otherwise normal. Recommendation:       - Patient has a contact number available for                        emergencies. The signs and symptoms of potential                        delayed complications were discussed with the patient.                        Return to normal activities tomorrow. Written discharge                        instructions were provided to the patient.                       - Resume previous diet.                       - Continue present medications.                       - Repeat colonoscopy is recommended for surveillance.                        The colonoscopy date will be determined after pathology                        results from today's exam become available for review.                       - Return to GI office PRN.                       - The findings and recommendations were discussed with  the patient. Procedure Code(s):    --- Professional ---                       [phone], Colonoscopy, flexible; with removal of tumor(s),                        polyp(s), or other lesion(s) by snare technique Diagnosis Code(s):    --- Professional ---                       K57.30, Diverticulosis of large intestine without                        perforation or abscess without bleeding                       K63.5, Polyp of colon                       K62.1, Rectal polyp                       K64.1, Second degree hemorrhoids                       Z86.010, Personal history of colonic polyps CPT copyright 2019 American Medical Association. All rights reserved. The codes documented in this report are preliminary and upon coder review may  be revised to meet current compliance  requirements. Efrain Sella MD, MD 02/15/2019 9:27:49 AM This report has been signed electronically. Number of Addenda: 0 Note Initiated On: 02/15/2019 8:42 AM Scope Withdrawal Time: 0 hours 7 minutes 22 seconds  Total Procedure Duration: 0 hours 13 minutes 6 seconds  Estimated Blood Loss: Estimated blood loss: none.      [facility]

## 2019-02-15 NOTE — Anesthesia Postprocedure Evaluation (Signed)
Anesthesia Post Note  Patient: Brandon Shelton  Procedure(s) Performed: COLONOSCOPY WITH PROPOFOL (N/A )  Patient location during evaluation: PACU Anesthesia Type: General Level of consciousness: awake and alert Pain management: pain level controlled Vital Signs Assessment: post-procedure vital signs reviewed and stable Respiratory status: spontaneous breathing, nonlabored ventilation and respiratory function stable Cardiovascular status: blood pressure returned to baseline and stable Postop Assessment: no apparent nausea or vomiting Anesthetic complications: no     Last Vitals:  Vitals:   02/15/19 0947 02/15/19 0957  BP: 137/69 138/68  Pulse:    Temp:    SpO2:      Last Pain:  Vitals:   02/15/19 0957  TempSrc:   PainSc: 0-No pain                 Durenda Hurt

## 2019-02-15 NOTE — Anesthesia Post-op Follow-up Note (Signed)
Anesthesia QCDR form completed.        

## 2019-02-15 NOTE — Transfer of Care (Signed)
Immediate Anesthesia Transfer of Care Note  Patient: Brandon Shelton  Procedure(s) Performed: COLONOSCOPY WITH PROPOFOL (N/A )  Patient Location: Endoscopy Unit  Anesthesia Type:General  Level of Consciousness: awake, alert  and oriented  Airway & Oxygen Therapy: Patient Spontanous Breathing  Post-op Assessment: Report given to RN and Post -op Vital signs reviewed and stable  Post vital signs: Reviewed and stable  Last Vitals:  Vitals Value Taken Time  BP    Temp    Pulse 81 02/15/19 0927  Resp 14 02/15/19 0927  SpO2 99 % 02/15/19 0927  Vitals shown include unvalidated device data.  Last Pain:  Vitals:   02/15/19 0847  PainSc: 0-No pain         Complications: No apparent anesthesia complications

## 2019-02-15 NOTE — Interval H&P Note (Signed)
History and Physical Interval Note:  02/15/2019 8:59 AM  Brandon Shelton  has presented today for surgery, with the diagnosis of PERSONAL HX.OF COLON POLYPS.  The various methods of treatment have been discussed with the patient and family. After consideration of risks, benefits and other options for treatment, the patient has consented to  Procedure(s): COLONOSCOPY WITH PROPOFOL (N/A) as a surgical intervention.  The patient's history has been reviewed, patient examined, no change in status, stable for surgery.  I have reviewed the patient's chart and labs.  Questions were answered to the patient's satisfaction.     [place], [place]

## 2019-02-16 ENCOUNTER — Encounter: Payer: Self-pay | Admitting: Internal Medicine

## 2019-02-16 LAB — SURGICAL PATHOLOGY

## 2019-03-29 ENCOUNTER — Other Ambulatory Visit: Payer: Medicare Other

## 2019-03-30 ENCOUNTER — Other Ambulatory Visit: Payer: Self-pay

## 2019-03-31 ENCOUNTER — Ambulatory Visit: Payer: Medicare Other | Admitting: Oncology

## 2019-03-31 ENCOUNTER — Other Ambulatory Visit: Payer: Self-pay

## 2019-03-31 ENCOUNTER — Inpatient Hospital Stay: Payer: Medicare Other | Attending: Oncology

## 2019-03-31 DIAGNOSIS — C61 Malignant neoplasm of prostate: Secondary | ICD-10-CM

## 2019-03-31 LAB — CBC WITH DIFFERENTIAL/PLATELET
Abs Immature Granulocytes: 0.04 10*3/uL (ref 0.00–0.07)
Basophils Absolute: 0.1 10*3/uL (ref 0.0–0.1)
Basophils Relative: 1 %
Eosinophils Absolute: 0.4 10*3/uL (ref 0.0–0.5)
Eosinophils Relative: 8 %
HCT: 39.7 % (ref 39.0–52.0)
Hemoglobin: 13.4 g/dL (ref 13.0–17.0)
Immature Granulocytes: 1 %
Lymphocytes Relative: 22 %
Lymphs Abs: 1 10*3/uL (ref 0.7–4.0)
MCH: 30.7 pg (ref 26.0–34.0)
MCHC: 33.8 g/dL (ref 30.0–36.0)
MCV: 91.1 fL (ref 80.0–100.0)
Monocytes Absolute: 0.4 10*3/uL (ref 0.1–1.0)
Monocytes Relative: 8 %
Neutro Abs: 2.7 10*3/uL (ref 1.7–7.7)
Neutrophils Relative %: 60 %
Platelets: 234 10*3/uL (ref 150–400)
RBC: 4.36 MIL/uL (ref 4.22–5.81)
RDW: 13.1 % (ref 11.5–15.5)
WBC: 4.5 10*3/uL (ref 4.0–10.5)
nRBC: 0 % (ref 0.0–0.2)

## 2019-03-31 LAB — COMPREHENSIVE METABOLIC PANEL
ALT: 15 U/L (ref 0–44)
AST: 17 U/L (ref 15–41)
Albumin: 3.7 g/dL (ref 3.5–5.0)
Alkaline Phosphatase: 63 U/L (ref 38–126)
Anion gap: 7 (ref 5–15)
BUN: 14 mg/dL (ref 8–23)
CO2: 28 mmol/L (ref 22–32)
Calcium: 8.8 mg/dL — ABNORMAL LOW (ref 8.9–10.3)
Chloride: 105 mmol/L (ref 98–111)
Creatinine, Ser: 0.98 mg/dL (ref 0.61–1.24)
GFR calc Af Amer: >60 mL/min (ref >60)
GFR calc non Af Amer: >60 mL/min (ref >60)
Glucose, Bld: 95 mg/dL (ref 70–99)
Potassium: 3.9 mmol/L (ref 3.5–5.1)
Sodium: 140 mmol/L (ref 135–145)
Total Bilirubin: 1 mg/dL (ref 0.3–1.2)
Total Protein: 6.5 g/dL (ref 6.5–8.1)

## 2019-03-31 LAB — PSA: Prostatic Specific Antigen: <0.01 ng/mL (ref 0.00–4.00)

## 2019-05-03 ENCOUNTER — Other Ambulatory Visit: Payer: Self-pay | Admitting: *Deleted

## 2019-05-03 DIAGNOSIS — C61 Malignant neoplasm of prostate: Secondary | ICD-10-CM

## 2019-05-03 MED ORDER — ABIRATERONE ACETATE 250 MG PO TABS: 1000.0000 mg | ORAL_TABLET | Freq: Every day | ORAL | 5 refills | Status: DC

## 2019-06-19 ENCOUNTER — Other Ambulatory Visit: Payer: Self-pay | Admitting: Oncology

## 2019-06-19 DIAGNOSIS — C61 Malignant neoplasm of prostate: Secondary | ICD-10-CM

## 2019-07-01 ENCOUNTER — Telehealth: Payer: Self-pay | Admitting: *Deleted

## 2019-07-01 NOTE — Telephone Encounter (Signed)
Patient called reporting that he had labs drawn at PCP this week when he went in for other problems. All albs we had ordered were drawn and he wanted his lab appointment cancelled and after checking in care everywhere, I cancelled his la appointment for Monday. He states he will keep his follow up appointment with Dr Grayland Ormond on 1/13

## 2019-07-03 NOTE — Progress Notes (Signed)
[place]  [phone] [phone]  ID: Brandon Shelton OB: Feb 04, 1946  MR#: [id number]  [id number]  Patient Care Team: Juluis Pitch, MD as PCP - General (Family Medicine)   CHIEF COMPLAINT: Prostate cancer  INTERVAL HISTORY: Patient returns to clinic for repeat laboratory work and routine 38-month evaluation.  He continues to feel well and is asymptomatic.  He is tolerating Zytiga and prednisone without significant side effects. He has no neurologic complaints. He denies any recent fevers or illnesses. He has a good appetite and denies weight loss.  He denies any chest pain, shortness of breath, cough, or hemoptysis. He denies any nausea, vomiting, constipation, or diarrhea. He has no urinary complaints. Patient offers no specific complaints today.  REVIEW OF SYSTEMS:   Review of Systems  Constitutional: Negative.  Negative for fever, malaise/fatigue and weight loss.  Respiratory: Negative.  Negative for cough and shortness of breath.   Cardiovascular: Negative.  Negative for chest pain and leg swelling.  Gastrointestinal: Negative.  Negative for abdominal pain, nausea and vomiting.  Genitourinary: Negative.  Negative for frequency.  Musculoskeletal: Negative.  Negative for back pain.  Skin: Negative.  Negative for rash.  Neurological: Negative.  Negative for focal weakness, weakness and headaches.  Endo/Heme/Allergies: Does not bruise/bleed easily.  Psychiatric/Behavioral: Negative.  The patient is not nervous/anxious.     As per HPI. Otherwise, a complete review of systems is negative.  PAST MEDICAL HISTORY: Past Medical History:  Diagnosis Date  . Bradycardia   . Diabetes mellitus without complication ([place])   . High cholesterol   . Osteopenia   . Palpitations   . Paroxysmal supraventricular tachycardia ([place])   . Prostate cancer [facility])    history of prostate cancer    PAST SURGICAL HISTORY: Prostatectomy.  FAMILY  HISTORY: Reviewed and unchanged. No reported history of malignancy or chronic disease.     ADVANCED DIRECTIVES:    HEALTH MAINTENANCE: Social History   Tobacco Use  . Smoking status: Former Smoker    Types: Cigarettes    Quit date: 06/24/1972    Years since quitting: 47.0  . Smokeless tobacco: Never Used  Substance Use Topics  . Alcohol use: No  . Drug use: No     Colonoscopy:  PAP:  Bone density:  Lipid panel:  No Known Allergies  Current Outpatient Medications  Medication Sig Dispense Refill  . abiraterone acetate (ZYTIGA) 250 MG tablet Take 4 tablets (1,000 mg total) by mouth daily. Take on an empty stomach 1 hour before or 2 hours after a meal 120 tablet 5  . atorvastatin (LIPITOR) 20 MG tablet TAKE 1/2 TABLET ONCE DAILY    . calcium-vitamin D (SM CALCIUM 500/VITAMIN D3) 500-400 MG-UNIT per tablet Take by mouth 2 (two) times daily.     Marland Kitchen diltiazem (CARDIZEM CD) 180 MG 24 hr capsule Take 180 mg by mouth daily.     . predniSONE (DELTASONE) 5 MG tablet Take 0.5 tablets (2.5 mg total) by mouth every 12 (twelve) hours. 90 tablet 0   No current facility-administered medications for this visit.    OBJECTIVE: Vitals:   07/07/19 1126  BP: 125/67  Pulse: 64  Resp: 16  SpO2: 99%     Body mass index is 26.18 kg/m.    ECOG FS:0 - Asymptomatic  General: Well-developed, well-nourished, no acute distress. Eyes: Pink conjunctiva, anicteric sclera. HEENT: Normocephalic, moist mucous membranes. Lungs: No audible wheezing or coughing. Heart: Regular rate and rhythm. Abdomen: Soft, nontender, no obvious distention. Musculoskeletal:  No edema, cyanosis, or clubbing. Neuro: Alert, answering all questions appropriately. Cranial nerves grossly intact. Skin: No rashes or petechiae noted. Psych: Normal affect.  LAB RESULTS:  Lab Results  Component Value Date   NA 140 03/31/2019   K 3.9 03/31/2019   CL 105 03/31/2019   CO2 28 03/31/2019   GLUCOSE 95 03/31/2019   BUN 14  03/31/2019   CREATININE 0.98 03/31/2019   CALCIUM 8.8 (L) 03/31/2019   PROT 6.5 03/31/2019   ALBUMIN 3.7 03/31/2019   AST 17 03/31/2019   ALT 15 03/31/2019   ALKPHOS 63 03/31/2019   BILITOT 1.0 03/31/2019   GFRNONAA >60 03/31/2019   GFRAA >60 03/31/2019    Lab Results  Component Value Date   WBC 4.5 03/31/2019   NEUTROABS 2.7 03/31/2019   HGB 13.4 03/31/2019   HCT 39.7 03/31/2019   MCV 91.1 03/31/2019   PLT 234 03/31/2019   Lab Results  Component Value Date   PSA <0.01 10/29/2016     STUDIES: No results found.  ASSESSMENT: Prostate cancer  PLAN:    1. Prostate cancer: No evidence of disease.  Continue Zytiga and 2.5 mg of prednisone twice per day.  Patient's PSA continues to be undetectable at less than 0.01.  Patient's PSA has been undetectable since at least 2013.  We again discussed with patient the possibility of discontinuing treatment, but he is not interested at this time.  Return to clinic in 3 months for laboratory work only and then in 6 months for laboratory work and further evaluation.  2. Elevated bilirubin: Resolved.  Patient's bilirubin is within normal limits today. 3.  DEXA scan: Patient is at risk for osteoporosis secondary to chronic use of low-dose prednisone.  His most recent bone mineral density on November 27, 2018 reported T score of -0.9 which is unchanged from 2 years prior and considered normal.  Repeat in June 2022.   4.  Elevated blood glucose: Mild, monitor.  I spent a total of 25 minutes face-to-face with the patient and reviewing chart data of which greater than 50% of the visit was spent in counseling and coordination of care as detailed above.   Patient expressed understanding and was in agreement with this plan. He also understands that He can call clinic at any time with any questions, concerns, or complaints.   Cancer Staging Cancer of prostate [facility]) Staging form: Prostate, AJCC 7th Edition - Clinical: Stage IV (M1, Gleason <= 6 - Well  differentiated (slight anaplasia)) - Signed by Evlyn Kanner, NP on 04/19/2015   [name], MD   07/08/2019 7:04 AM

## 2019-07-05 ENCOUNTER — Inpatient Hospital Stay: Payer: Medicare Other

## 2019-07-06 ENCOUNTER — Other Ambulatory Visit: Payer: Self-pay

## 2019-07-06 NOTE — Progress Notes (Signed)
Patient pre screened for office appointment, no questions or concerns today. Patient reminded of upcoming appointment time and date. Patient reports that he had his labs drawn last week.

## 2019-07-07 ENCOUNTER — Other Ambulatory Visit: Payer: Self-pay

## 2019-07-07 ENCOUNTER — Inpatient Hospital Stay: Payer: Medicare Other | Attending: Oncology | Admitting: Oncology

## 2019-07-07 VITALS — BP 125/67 | HR 64 | Resp 16 | Wt 162.2 lb

## 2019-07-07 DIAGNOSIS — Z7952 Long term (current) use of systemic steroids: Secondary | ICD-10-CM | POA: Diagnosis not present

## 2019-07-07 DIAGNOSIS — Z87891 Personal history of nicotine dependence: Secondary | ICD-10-CM | POA: Insufficient documentation

## 2019-07-07 DIAGNOSIS — C61 Malignant neoplasm of prostate: Secondary | ICD-10-CM | POA: Diagnosis not present

## 2019-07-07 DIAGNOSIS — R739 Hyperglycemia, unspecified: Secondary | ICD-10-CM | POA: Insufficient documentation

## 2019-09-16 ENCOUNTER — Other Ambulatory Visit: Payer: Self-pay | Admitting: Oncology

## 2019-09-16 DIAGNOSIS — C61 Malignant neoplasm of prostate: Secondary | ICD-10-CM

## 2019-10-05 ENCOUNTER — Other Ambulatory Visit: Payer: Medicare Other

## 2019-10-06 ENCOUNTER — Inpatient Hospital Stay: Payer: Medicare Other

## 2019-10-23 ENCOUNTER — Other Ambulatory Visit: Payer: Self-pay | Admitting: Oncology

## 2019-10-23 DIAGNOSIS — C61 Malignant neoplasm of prostate: Secondary | ICD-10-CM

## 2019-11-29 ENCOUNTER — Telehealth: Payer: Self-pay | Admitting: Oncology

## 2019-11-29 NOTE — Telephone Encounter (Signed)
Patient phoned stating that he would not be available on the scheduled date in August. Appts rescheduled with patient.

## 2019-12-22 ENCOUNTER — Ambulatory Visit (INDEPENDENT_AMBULATORY_CARE_PROVIDER_SITE_OTHER): Payer: Medicare Other | Admitting: Dermatology

## 2019-12-22 ENCOUNTER — Other Ambulatory Visit: Payer: Self-pay

## 2019-12-22 ENCOUNTER — Encounter: Payer: Self-pay | Admitting: Dermatology

## 2019-12-22 DIAGNOSIS — Z86018 Personal history of other benign neoplasm: Secondary | ICD-10-CM | POA: Diagnosis not present

## 2019-12-22 DIAGNOSIS — Z1283 Encounter for screening for malignant neoplasm of skin: Secondary | ICD-10-CM

## 2019-12-22 DIAGNOSIS — L720 Epidermal cyst: Secondary | ICD-10-CM | POA: Diagnosis not present

## 2019-12-22 DIAGNOSIS — D229 Melanocytic nevi, unspecified: Secondary | ICD-10-CM

## 2019-12-22 DIAGNOSIS — D1800 Hemangioma unspecified site: Secondary | ICD-10-CM

## 2019-12-22 DIAGNOSIS — L820 Inflamed seborrheic keratosis: Secondary | ICD-10-CM

## 2019-12-22 DIAGNOSIS — L821 Other seborrheic keratosis: Secondary | ICD-10-CM

## 2019-12-22 DIAGNOSIS — L814 Other melanin hyperpigmentation: Secondary | ICD-10-CM

## 2019-12-22 DIAGNOSIS — L578 Other skin changes due to chronic exposure to nonionizing radiation: Secondary | ICD-10-CM

## 2019-12-22 NOTE — Progress Notes (Signed)
   Follow-Up Visit   Subjective  Brandon Shelton is a 74 y.o. male who presents for the following: Annual Exam (Total body skin exam, hx of Dysplastic nevi).  The patient presents for Total-Body Skin Exam (TBSE) for skin cancer screening and mole check.  The following portions of the chart were reviewed this encounter and updated as appropriate:  Tobacco  Allergies  Meds  Problems  Med Hx  Surg Hx  Fam Hx      Review of Systems:  No other skin or systemic complaints except as noted in HPI or Assessment and Plan.  Objective  Well appearing patient in no apparent distress; mood and affect are within normal limits.  A full examination was performed including scalp, head, eyes, ears, nose, lips, neck, chest, axillae, abdomen, back, buttocks, bilateral upper extremities, bilateral lower extremities, hands, feet, fingers, toes, fingernails, and toenails. All findings within normal limits unless otherwise noted below.  Objective  multiple: Scars with no evidence of recurrence.   Objective  back x 4, L mandible x 1 (5): Erythematous keratotic or waxy stuck-on papule or plaque.   Objective  Right lower lip: Cystic pap   Assessment & Plan    Lentigines - Scattered tan macules - Discussed due to sun exposure - Benign, observe - Call for any changes  Seborrheic Keratoses - Stuck-on, waxy, tan-brown papules and plaques  - Discussed benign etiology and prognosis. - Observe - Call for any changes  Melanocytic Nevi - Tan-brown and/or pink-flesh-colored symmetric macules and papules - Benign appearing on exam today - Observation - Call clinic for new or changing moles - Recommend daily use of broad spectrum spf 30+ sunscreen to sun-exposed areas.   Hemangiomas - Red papules - Discussed benign nature - Observe - Call for any changes  Actinic Damage - diffuse scaly erythematous macules with underlying dyspigmentation - Recommend daily broad spectrum sunscreen SPF  30+ to sun-exposed areas, reapply every 2 hours as needed.  - Call for new or changing lesions.  Skin cancer screening performed today.   History of dysplastic nevus multiple  Clear, observe for changes   Inflamed seborrheic keratosis (5) back x 4, L mandible x 1  Destruction of lesion - back x 4, L mandible x 1 Complexity: simple   Destruction method: cryotherapy   Informed consent: discussed and consent obtained   Timeout:  patient name, date of birth, surgical site, and procedure verified Lesion destroyed using liquid nitrogen: Yes   Region frozen until ice ball extended beyond lesion: Yes   Outcome: patient tolerated procedure well with no complications   Post-procedure details: wound care instructions given    Epidermal cyst Right lower lip  Benign, discussed excising  Skin cancer screening  Return in about 1 year (around 12/21/2020) for TBSE hx of dysplastic nevi.  I, Othelia Pulling, RMA, am acting as scribe for Sarina Ser, MD .  Documentation: I have reviewed the above documentation for accuracy and completeness, and I agree with the above.  Sarina Ser, MD

## 2019-12-22 NOTE — Patient Instructions (Signed)
Cryotherapy Aftercare  . Wash gently with soap and water everyday.   . Apply Vaseline and Band-Aid daily until healed.  

## 2019-12-29 ENCOUNTER — Other Ambulatory Visit: Payer: Self-pay | Admitting: Oncology

## 2019-12-29 DIAGNOSIS — C61 Malignant neoplasm of prostate: Secondary | ICD-10-CM

## 2020-01-03 ENCOUNTER — Encounter: Payer: Self-pay | Admitting: Dermatology

## 2020-01-04 ENCOUNTER — Other Ambulatory Visit: Payer: Medicare Other

## 2020-01-06 ENCOUNTER — Ambulatory Visit: Payer: Medicare Other | Admitting: Oncology

## 2020-01-25 ENCOUNTER — Inpatient Hospital Stay: Payer: Medicare Other

## 2020-01-28 ENCOUNTER — Other Ambulatory Visit: Payer: Medicare Other

## 2020-01-28 ENCOUNTER — Ambulatory Visit: Payer: Medicare Other | Admitting: Oncology

## 2020-01-31 ENCOUNTER — Inpatient Hospital Stay: Payer: Medicare Other | Attending: Oncology

## 2020-01-31 ENCOUNTER — Other Ambulatory Visit: Payer: Self-pay

## 2020-01-31 DIAGNOSIS — C61 Malignant neoplasm of prostate: Secondary | ICD-10-CM | POA: Diagnosis present

## 2020-01-31 DIAGNOSIS — Z87891 Personal history of nicotine dependence: Secondary | ICD-10-CM | POA: Diagnosis not present

## 2020-01-31 DIAGNOSIS — R739 Hyperglycemia, unspecified: Secondary | ICD-10-CM | POA: Insufficient documentation

## 2020-01-31 LAB — CBC WITH DIFFERENTIAL/PLATELET
Abs Immature Granulocytes: 0.05 10*3/uL (ref 0.00–0.07)
Basophils Absolute: 0.1 10*3/uL (ref 0.0–0.1)
Basophils Relative: 1 %
Eosinophils Absolute: 0.3 10*3/uL (ref 0.0–0.5)
Eosinophils Relative: 6 %
HCT: 40.6 % (ref 39.0–52.0)
Hemoglobin: 14 g/dL (ref 13.0–17.0)
Immature Granulocytes: 1 %
Lymphocytes Relative: 17 %
Lymphs Abs: 0.9 10*3/uL (ref 0.7–4.0)
MCH: 31.5 pg (ref 26.0–34.0)
MCHC: 34.5 g/dL (ref 30.0–36.0)
MCV: 91.2 fL (ref 80.0–100.0)
Monocytes Absolute: 0.3 10*3/uL (ref 0.1–1.0)
Monocytes Relative: 5 %
Neutro Abs: 3.6 10*3/uL (ref 1.7–7.7)
Neutrophils Relative %: 70 %
Platelets: 224 10*3/uL (ref 150–400)
RBC: 4.45 MIL/uL (ref 4.22–5.81)
RDW: 12.9 % (ref 11.5–15.5)
WBC: 5.1 10*3/uL (ref 4.0–10.5)
nRBC: 0 % (ref 0.0–0.2)

## 2020-01-31 LAB — COMPREHENSIVE METABOLIC PANEL
ALT: 14 U/L (ref 0–44)
AST: 16 U/L (ref 15–41)
Albumin: 3.9 g/dL (ref 3.5–5.0)
Alkaline Phosphatase: 61 U/L (ref 38–126)
Anion gap: 8 (ref 5–15)
BUN: 17 mg/dL (ref 8–23)
CO2: 27 mmol/L (ref 22–32)
Calcium: 8.7 mg/dL — ABNORMAL LOW (ref 8.9–10.3)
Chloride: 102 mmol/L (ref 98–111)
Creatinine, Ser: 0.89 mg/dL (ref 0.61–1.24)
GFR calc Af Amer: >60 mL/min (ref >60)
GFR calc non Af Amer: >60 mL/min (ref >60)
Glucose, Bld: 205 mg/dL — ABNORMAL HIGH (ref 70–99)
Potassium: 4.4 mmol/L (ref 3.5–5.1)
Sodium: 137 mmol/L (ref 135–145)
Total Bilirubin: 1.3 mg/dL — ABNORMAL HIGH (ref 0.3–1.2)
Total Protein: 6.2 g/dL — ABNORMAL LOW (ref 6.5–8.1)

## 2020-02-01 ENCOUNTER — Telehealth: Payer: Self-pay | Admitting: *Deleted

## 2020-02-01 ENCOUNTER — Ambulatory Visit: Payer: Medicare Other | Admitting: Oncology

## 2020-02-01 LAB — PSA: Prostatic Specific Antigen: <0.01 ng/mL (ref 0.00–4.00)

## 2020-02-01 NOTE — Telephone Encounter (Signed)
Lab called with corrected report of PSA as <0.01

## 2020-02-04 NOTE — Progress Notes (Signed)
[place]  Telephone:(336) [phone] Fax:(336) [phone]  ID: Brandon Shelton OB: 1946/06/13  MR#: [id number]  BPZ#:[id number]  Patient Care Team: Juluis Pitch, MD as PCP - General (Family Medicine)   CHIEF COMPLAINT: Prostate cancer  INTERVAL HISTORY: Patient returns to clinic today for repeat laboratory work and routine 38-month evaluation.  He continues to feel well and remains asymptomatic.  He continues to tolerate Zytiga and prednisone without significant side effects. He has no neurologic complaints. He denies any recent fevers or illnesses. He has a good appetite and denies weight loss.  He denies any chest pain, shortness of breath, cough, or hemoptysis. He denies any nausea, vomiting, constipation, or diarrhea. He has no urinary complaints.  Patient offers no specific complaints today.  REVIEW OF SYSTEMS:   Review of Systems  Constitutional: Negative.  Negative for fever, malaise/fatigue and weight loss.  Respiratory: Negative.  Negative for cough and shortness of breath.   Cardiovascular: Negative.  Negative for chest pain and leg swelling.  Gastrointestinal: Negative.  Negative for abdominal pain, nausea and vomiting.  Genitourinary: Negative.  Negative for frequency.  Musculoskeletal: Negative.  Negative for back pain.  Skin: Negative.  Negative for rash.  Neurological: Negative.  Negative for focal weakness, weakness and headaches.  Endo/Heme/Allergies: Does not bruise/bleed easily.  Psychiatric/Behavioral: Negative.  The patient is not nervous/anxious.     As per HPI. Otherwise, a complete review of systems is negative.  PAST MEDICAL HISTORY: Past Medical History:  Diagnosis Date  . Atypical mole 12/30/2013   L distal lat thigh/mild  . Atypical mole 08/06/2017   R super pubic/mild  . Atypical mole 10/07/2017   R inf medial scapula/excision  . Atypical mole 02/04/2018   R ant deltoid sup/mod  . Atypical mole 06/02/2018   R ant deltoid  inf/excision  . Atypical mole 02/17/2019   R medial pectoral/mild  . Bradycardia   . Diabetes mellitus without complication ([place])   . High cholesterol   . Osteopenia   . Palpitations   . Paroxysmal supraventricular tachycardia ([place])   . Prostate cancer [facility])    history of prostate cancer    PAST SURGICAL HISTORY: Prostatectomy.  FAMILY HISTORY: Reviewed and unchanged. No reported history of malignancy or chronic disease.     ADVANCED DIRECTIVES:    HEALTH MAINTENANCE: Social History   Tobacco Use  . Smoking status: Former Smoker    Types: Cigarettes    Quit date: 06/24/1972    Years since quitting: 47.6  . Smokeless tobacco: Never Used  Vaping Use  . Vaping Use: Never used  Substance Use Topics  . Alcohol use: No  . Drug use: No     Colonoscopy:  PAP:  Bone density:  Lipid panel:  No Known Allergies  Current Outpatient Medications  Medication Sig Dispense Refill  . abiraterone acetate (ZYTIGA) 250 MG tablet TAKE 4 TABLETS DAILY ON AN EMPTY STOMACH 1 HOUR BEFORE OR 2 HOURS AFTER A MEAL 120 tablet 5  . atorvastatin (LIPITOR) 20 MG tablet TAKE 1/2 TABLET ONCE DAILY    . calcium-vitamin D (SM CALCIUM 500/VITAMIN D3) 500-400 MG-UNIT per tablet Take by mouth 2 (two) times daily.     Marland Kitchen diltiazem (CARDIZEM CD) 180 MG 24 hr capsule Take 180 mg by mouth daily.     . predniSONE (DELTASONE) 5 MG tablet Take 0.5 tablets (2.5 mg total) by mouth every 12 (twelve) hours. 90 tablet 0   No current facility-administered medications for this visit.  OBJECTIVE: Vitals:   02/07/20 0949  BP: 112/68  Pulse: 62  Resp: 18  Temp: 98.4 F (36.9 C)  SpO2: 100%     Body mass index is 24.94 kg/m.    ECOG FS:0 - Asymptomatic  General: Well-developed, well-nourished, no acute distress. Eyes: Pink conjunctiva, anicteric sclera. HEENT: Normocephalic, moist mucous membranes. Lungs: No audible wheezing or coughing. Heart: Regular rate and rhythm. Abdomen: Soft, nontender, no  obvious distention. Musculoskeletal: No edema, cyanosis, or clubbing. Neuro: Alert, answering all questions appropriately. Cranial nerves grossly intact. Skin: No rashes or petechiae noted. Psych: Normal affect.  LAB RESULTS:  Lab Results  Component Value Date   NA 137 01/31/2020   K 4.4 01/31/2020   CL 102 01/31/2020   CO2 27 01/31/2020   GLUCOSE 205 (H) 01/31/2020   BUN 17 01/31/2020   CREATININE 0.89 01/31/2020   CALCIUM 8.7 (L) 01/31/2020   PROT 6.2 (L) 01/31/2020   ALBUMIN 3.9 01/31/2020   AST 16 01/31/2020   ALT 14 01/31/2020   ALKPHOS 61 01/31/2020   BILITOT 1.3 (H) 01/31/2020   GFRNONAA >60 01/31/2020   GFRAA >60 01/31/2020    Lab Results  Component Value Date   WBC 5.1 01/31/2020   NEUTROABS 3.6 01/31/2020   HGB 14.0 01/31/2020   HCT 40.6 01/31/2020   MCV 91.2 01/31/2020   PLT 224 01/31/2020   Lab Results  Component Value Date   PSA <0.01 10/29/2016     STUDIES: No results found.  ASSESSMENT: Prostate cancer  PLAN:    1. Prostate cancer: No evidence of disease.  Continue 1000 mg Zytiga and 2.5 mg of prednisone twice per day.  Patient's PSA continues to be undetectable at less than 0.01, today's result is pending.  Patient's PSA has been undetectable since at least 2013.  We again discussed with patient the possibility of discontinuing treatment, but he is not interested at this time.  Patient did indicate he would be interested in a possible dose reduction and only taking 750 mg Zytiga daily.  Return to clinic in 3 months for laboratory work only and then in 6 months for laboratory work and video assisted telemedicine visit. 2. Elevated bilirubin: Mild, monitor.  Patient's bilirubin is 1.3. 3.  DEXA scan: Patient is at risk for osteoporosis secondary to chronic use of low-dose prednisone.  His most recent bone mineral density on November 27, 2018 reported T score of -0.9 which is unchanged from 2 years prior and considered normal.  Repeat in June 2022.   4.   Elevated blood glucose: Patient was not fasting prior to lab work.  Monitor.  I spent a total of 30 minutes reviewing chart data, face-to-face evaluation with the patient, counseling and coordination of care as detailed above.   Patient expressed understanding and was in agreement with this plan. He also understands that He can call clinic at any time with any questions, concerns, or complaints.   Cancer Staging Cancer of prostate [facility]) Staging form: Prostate, AJCC 7th Edition - Clinical: Stage IV (M1, Gleason <= 6 - Well differentiated (slight anaplasia)) - Signed by Evlyn Kanner, NP on 04/19/2015   [name], MD   02/07/2020 12:11 PM

## 2020-02-07 ENCOUNTER — Encounter: Payer: Self-pay | Admitting: Oncology

## 2020-02-07 ENCOUNTER — Inpatient Hospital Stay (HOSPITAL_BASED_OUTPATIENT_CLINIC_OR_DEPARTMENT_OTHER): Payer: Medicare Other | Admitting: Oncology

## 2020-02-07 ENCOUNTER — Other Ambulatory Visit: Payer: Self-pay

## 2020-02-07 VITALS — BP 112/68 | HR 62 | Temp 98.4°F | Resp 18 | Wt 154.5 lb

## 2020-02-07 DIAGNOSIS — C61 Malignant neoplasm of prostate: Secondary | ICD-10-CM

## 2020-02-07 NOTE — Progress Notes (Signed)
Patient denies any pain or concerns at today's follow up.

## 2020-03-31 ENCOUNTER — Other Ambulatory Visit: Payer: Self-pay | Admitting: Oncology

## 2020-03-31 DIAGNOSIS — C61 Malignant neoplasm of prostate: Secondary | ICD-10-CM

## 2020-04-20 ENCOUNTER — Other Ambulatory Visit: Payer: Self-pay | Admitting: Oncology

## 2020-05-09 ENCOUNTER — Inpatient Hospital Stay: Payer: Medicare Other

## 2020-05-10 ENCOUNTER — Other Ambulatory Visit: Payer: Self-pay | Admitting: *Deleted

## 2020-05-10 DIAGNOSIS — C61 Malignant neoplasm of prostate: Secondary | ICD-10-CM

## 2020-05-11 ENCOUNTER — Other Ambulatory Visit: Payer: Self-pay

## 2020-05-11 ENCOUNTER — Inpatient Hospital Stay: Payer: Medicare Other | Attending: Oncology

## 2020-05-11 DIAGNOSIS — C61 Malignant neoplasm of prostate: Secondary | ICD-10-CM | POA: Diagnosis present

## 2020-05-11 LAB — CBC WITH DIFFERENTIAL/PLATELET
Abs Immature Granulocytes: 0.07 10*3/uL (ref 0.00–0.07)
Basophils Absolute: 0.1 10*3/uL (ref 0.0–0.1)
Basophils Relative: 1 %
Eosinophils Absolute: 0.3 10*3/uL (ref 0.0–0.5)
Eosinophils Relative: 5 %
HCT: 42.7 % (ref 39.0–52.0)
Hemoglobin: 14.4 g/dL (ref 13.0–17.0)
Immature Granulocytes: 1 %
Lymphocytes Relative: 13 %
Lymphs Abs: 0.7 10*3/uL (ref 0.7–4.0)
MCH: 31.1 pg (ref 26.0–34.0)
MCHC: 33.7 g/dL (ref 30.0–36.0)
MCV: 92.2 fL (ref 80.0–100.0)
Monocytes Absolute: 0.4 10*3/uL (ref 0.1–1.0)
Monocytes Relative: 7 %
Neutro Abs: 4.1 10*3/uL (ref 1.7–7.7)
Neutrophils Relative %: 73 %
Platelets: 235 10*3/uL (ref 150–400)
RBC: 4.63 MIL/uL (ref 4.22–5.81)
RDW: 12.9 % (ref 11.5–15.5)
WBC: 5.7 10*3/uL (ref 4.0–10.5)
nRBC: 0 % (ref 0.0–0.2)

## 2020-05-11 LAB — COMPREHENSIVE METABOLIC PANEL
ALT: 14 U/L (ref 0–44)
AST: 16 U/L (ref 15–41)
Albumin: 4.1 g/dL (ref 3.5–5.0)
Alkaline Phosphatase: 62 U/L (ref 38–126)
Anion gap: 7 (ref 5–15)
BUN: 14 mg/dL (ref 8–23)
CO2: 29 mmol/L (ref 22–32)
Calcium: 9.3 mg/dL (ref 8.9–10.3)
Chloride: 101 mmol/L (ref 98–111)
Creatinine, Ser: 0.86 mg/dL (ref 0.61–1.24)
GFR, Estimated: >60 mL/min (ref >60)
Glucose, Bld: 99 mg/dL (ref 70–99)
Potassium: 4.5 mmol/L (ref 3.5–5.1)
Sodium: 137 mmol/L (ref 135–145)
Total Bilirubin: 1.2 mg/dL (ref 0.3–1.2)
Total Protein: 6.7 g/dL (ref 6.5–8.1)

## 2020-05-12 LAB — PSA, TOTAL AND FREE
PSA, Free Pct: UNDETERMINED %
PSA, Free: <0.01 ng/mL
Prostate Specific Ag, Serum: <0.1 ng/mL

## 2020-08-05 NOTE — Progress Notes (Signed)
[place]  Telephone:(336) [phone] [phone]) [phone]  ID: Brandon Shelton OB: 11/20/1945  MR#: [id number]  FXT#:[id number]  Patient Care Team: Juluis Pitch, MD as PCP - General (Family Medicine) [name], MD as Consulting Physician (Hematology and Oncology)  I connected with Brandon Shelton on 08/05/20 at  2:45 PM EST by video enabled telemedicine visit and verified that I am speaking with the correct person using two identifiers.   I discussed the limitations, risks, security and privacy concerns of performing an evaluation and management service by telemedicine and the availability of in-person appointments. I also discussed with the patient that there may be a patient responsible charge related to this service. The patient expressed understanding and agreed to proceed.   Other persons participating in the visit and their role in the encounter: Patient, MD.  Patient's location: Home. Provider's location: Clinic.  CHIEF COMPLAINT: Prostate cancer  INTERVAL HISTORY: Patient agreed to video assisted telemedicine visit for routine 68-month evaluation.  He continues to feel well and remains asymptomatic.  He is tolerating Zytiga and prednisone without significant side effects. He has no neurologic complaints. He denies any recent fevers or illnesses. He has a good appetite and denies weight loss.  He denies any chest pain, shortness of breath, cough, or hemoptysis. He denies any nausea, vomiting, constipation, or diarrhea. He has no urinary complaints.  Patient feels at his baseline offers no specific complaints today.  REVIEW OF SYSTEMS:   Review of Systems  Constitutional: Negative.  Negative for fever, malaise/fatigue and weight loss.  Respiratory: Negative.  Negative for cough and shortness of breath.   Cardiovascular: Negative.  Negative for chest pain and leg swelling.  Gastrointestinal: Negative.  Negative for abdominal pain, nausea and  vomiting.  Genitourinary: Negative.  Negative for frequency.  Musculoskeletal: Negative.  Negative for back pain.  Skin: Negative.  Negative for rash.  Neurological: Negative.  Negative for focal weakness, weakness and headaches.  Endo/Heme/Allergies: Does not bruise/bleed easily.  Psychiatric/Behavioral: Negative.  The patient is not nervous/anxious.     As per HPI. Otherwise, a complete review of systems is negative.  PAST MEDICAL HISTORY: Past Medical History:  Diagnosis Date  . Atypical mole 12/30/2013   L distal lat thigh/mild  . Atypical mole 08/06/2017   R super pubic/mild  . Atypical mole 10/07/2017   R inf medial scapula/excision  . Atypical mole 02/04/2018   R ant deltoid sup/mod  . Atypical mole 06/02/2018   R ant deltoid inf/excision  . Atypical mole 02/17/2019   R medial pectoral/mild  . Bradycardia   . Diabetes mellitus without complication ([place])   . High cholesterol   . Osteopenia   . Palpitations   . Paroxysmal supraventricular tachycardia ([place])   . Prostate cancer [facility])    history of prostate cancer    PAST SURGICAL HISTORY: Prostatectomy.  FAMILY HISTORY: Reviewed and unchanged. No reported history of malignancy or chronic disease.     ADVANCED DIRECTIVES:    HEALTH MAINTENANCE: Social History   Tobacco Use  . Smoking status: Former Smoker    Types: Cigarettes    Quit date: 06/24/1972    Years since quitting: 48.1  . Smokeless tobacco: Never Used  Vaping Use  . Vaping Use: Never used  Substance Use Topics  . Alcohol use: No  . Drug use: No     Colonoscopy:  PAP:  Bone density:  Lipid panel:  No Known Allergies  Current Outpatient Medications  Medication Sig Dispense Refill  .  abiraterone acetate (ZYTIGA) 250 MG tablet TAKE 4 TABLETS DAILY ON AN EMPTY STOMACH 1 HOUR BEFORE OR 2 HOURS AFTER A MEAL 120 tablet 5  . atorvastatin (LIPITOR) 20 MG tablet TAKE 1/2 TABLET ONCE DAILY    . calcium-vitamin D (SM CALCIUM 500/VITAMIN D3) 500-400  MG-UNIT per tablet Take by mouth 2 (two) times daily.     Marland Kitchen diltiazem (CARDIZEM CD) 180 MG 24 hr capsule Take 180 mg by mouth daily.     . predniSONE (DELTASONE) 5 MG tablet Take 0.5 tablets (2.5 mg total) by mouth every 12 (twelve) hours. 90 tablet 0   No current facility-administered medications for this visit.    OBJECTIVE: There were no vitals filed for this visit.   There is no height or weight on file to calculate BMI.    ECOG FS:0 - Asymptomatic  General: Well-developed, well-nourished, no acute distress. HEENT: Normocephalic. Neuro: Alert, answering all questions appropriately. Cranial nerves grossly intact. Psych: Normal affect.   LAB RESULTS:  Lab Results  Component Value Date   NA 137 05/11/2020   K 4.5 05/11/2020   CL 101 05/11/2020   CO2 29 05/11/2020   GLUCOSE 99 05/11/2020   BUN 14 05/11/2020   CREATININE 0.86 05/11/2020   CALCIUM 9.3 05/11/2020   PROT 6.7 05/11/2020   ALBUMIN 4.1 05/11/2020   AST 16 05/11/2020   ALT 14 05/11/2020   ALKPHOS 62 05/11/2020   BILITOT 1.2 05/11/2020   GFRNONAA >60 05/11/2020   GFRAA >60 01/31/2020    Lab Results  Component Value Date   WBC 5.7 05/11/2020   NEUTROABS 4.1 05/11/2020   HGB 14.4 05/11/2020   HCT 42.7 05/11/2020   MCV 92.2 05/11/2020   PLT 235 05/11/2020   Lab Results  Component Value Date   PSA <0.01 10/29/2016     STUDIES: No results found.  ASSESSMENT: Prostate cancer  PLAN:    1. Prostate cancer: No evidence of disease.  Patient's PSA continues to remain undetectable at less than 0.01.  His PSA has been undetectable since at least 2013.  Continue 1000 mg Zytiga and 2.5 mg of prednisone twice per day. We again discussed with patient the possibility of discontinuing treatment, but he is not interested at this time.  Return to clinic in 3 months for laboratory work only and then in 6 months for laboratory work and video assisted telemedicine visit. 2. Elevated bilirubin: Chronic and unchanged. 3.   DEXA scan: Patient is at risk for osteoporosis secondary to chronic use of low-dose prednisone.  His most recent bone mineral density on November 27, 2018 reported T score of -0.9 which is unchanged from 2 years prior and considered normal.  Repeat in June 2020 due.   I provided 20 minutes of face-to-face video visit time during this encounter which included chart review, counseling, and coordination of care as documented above.  Patient expressed understanding and was in agreement with this plan. He also understands that He can call clinic at any time with any questions, concerns, or complaints.   Cancer Staging Cancer of prostate [facility]) Staging form: Prostate, AJCC 7th Edition - Clinical: Stage IV (M1, Gleason <= 6 - Well differentiated (slight anaplasia)) - Signed by Evlyn Kanner, NP on 04/19/2015 Gleason score: 0   [name], MD   08/05/2020 9:07 AM

## 2020-08-09 ENCOUNTER — Other Ambulatory Visit: Payer: Self-pay

## 2020-08-09 ENCOUNTER — Inpatient Hospital Stay: Payer: Medicare Other | Attending: Oncology

## 2020-08-09 DIAGNOSIS — C61 Malignant neoplasm of prostate: Secondary | ICD-10-CM | POA: Diagnosis not present

## 2020-08-09 LAB — COMPREHENSIVE METABOLIC PANEL
ALT: 15 U/L (ref 0–44)
AST: 17 U/L (ref 15–41)
Albumin: 4.2 g/dL (ref 3.5–5.0)
Alkaline Phosphatase: 66 U/L (ref 38–126)
Anion gap: 10 (ref 5–15)
BUN: 18 mg/dL (ref 8–23)
CO2: 28 mmol/L (ref 22–32)
Calcium: 9.5 mg/dL (ref 8.9–10.3)
Chloride: 101 mmol/L (ref 98–111)
Creatinine, Ser: 0.8 mg/dL (ref 0.61–1.24)
GFR, Estimated: >60 mL/min (ref >60)
Glucose, Bld: 104 mg/dL — ABNORMAL HIGH (ref 70–99)
Potassium: 4.2 mmol/L (ref 3.5–5.1)
Sodium: 139 mmol/L (ref 135–145)
Total Bilirubin: 1.4 mg/dL — ABNORMAL HIGH (ref 0.3–1.2)
Total Protein: 7.2 g/dL (ref 6.5–8.1)

## 2020-08-09 LAB — CBC WITH DIFFERENTIAL/PLATELET
Abs Immature Granulocytes: 0.08 10*3/uL — ABNORMAL HIGH (ref 0.00–0.07)
Basophils Absolute: 0.1 10*3/uL (ref 0.0–0.1)
Basophils Relative: 1 %
Eosinophils Absolute: 0.4 10*3/uL (ref 0.0–0.5)
Eosinophils Relative: 5 %
HCT: 44 % (ref 39.0–52.0)
Hemoglobin: 15.1 g/dL (ref 13.0–17.0)
Immature Granulocytes: 1 %
Lymphocytes Relative: 10 %
Lymphs Abs: 0.7 10*3/uL (ref 0.7–4.0)
MCH: 31.3 pg (ref 26.0–34.0)
MCHC: 34.3 g/dL (ref 30.0–36.0)
MCV: 91.1 fL (ref 80.0–100.0)
Monocytes Absolute: 0.5 10*3/uL (ref 0.1–1.0)
Monocytes Relative: 7 %
Neutro Abs: 5.9 10*3/uL (ref 1.7–7.7)
Neutrophils Relative %: 76 %
Platelets: 242 10*3/uL (ref 150–400)
RBC: 4.83 MIL/uL (ref 4.22–5.81)
RDW: 12.6 % (ref 11.5–15.5)
WBC: 7.7 10*3/uL (ref 4.0–10.5)
nRBC: 0 % (ref 0.0–0.2)

## 2020-08-09 LAB — PSA: Prostatic Specific Antigen: <0.01 ng/mL (ref 0.00–4.00)

## 2020-08-10 ENCOUNTER — Encounter: Payer: Self-pay | Admitting: Oncology

## 2020-08-10 ENCOUNTER — Inpatient Hospital Stay (HOSPITAL_BASED_OUTPATIENT_CLINIC_OR_DEPARTMENT_OTHER): Payer: Medicare Other | Admitting: Oncology

## 2020-08-10 ENCOUNTER — Ambulatory Visit: Payer: Medicare Other | Admitting: Oncology

## 2020-08-10 DIAGNOSIS — C61 Malignant neoplasm of prostate: Secondary | ICD-10-CM

## 2020-08-10 DIAGNOSIS — M8580 Other specified disorders of bone density and structure, unspecified site: Secondary | ICD-10-CM

## 2020-08-10 NOTE — Progress Notes (Signed)
Patient denies any concerns today.  

## 2020-08-23 DIAGNOSIS — I071 Rheumatic tricuspid insufficiency: Secondary | ICD-10-CM | POA: Insufficient documentation

## 2020-08-23 DIAGNOSIS — I340 Nonrheumatic mitral (valve) insufficiency: Secondary | ICD-10-CM | POA: Insufficient documentation

## 2020-09-02 ENCOUNTER — Other Ambulatory Visit: Payer: Self-pay | Admitting: Oncology

## 2020-09-02 DIAGNOSIS — C61 Malignant neoplasm of prostate: Secondary | ICD-10-CM

## 2020-11-09 ENCOUNTER — Inpatient Hospital Stay: Payer: Medicare Other | Attending: Oncology

## 2020-11-09 ENCOUNTER — Other Ambulatory Visit: Payer: Self-pay

## 2020-11-09 DIAGNOSIS — C61 Malignant neoplasm of prostate: Secondary | ICD-10-CM

## 2020-11-09 LAB — CBC WITH DIFFERENTIAL/PLATELET
Abs Immature Granulocytes: 0.07 10*3/uL (ref 0.00–0.07)
Basophils Absolute: 0 10*3/uL (ref 0.0–0.1)
Basophils Relative: 1 %
Eosinophils Absolute: 0.4 10*3/uL (ref 0.0–0.5)
Eosinophils Relative: 6 %
HCT: 41.6 % (ref 39.0–52.0)
Hemoglobin: 14.2 g/dL (ref 13.0–17.0)
Immature Granulocytes: 1 %
Lymphocytes Relative: 9 %
Lymphs Abs: 0.6 10*3/uL — ABNORMAL LOW (ref 0.7–4.0)
MCH: 31.3 pg (ref 26.0–34.0)
MCHC: 34.1 g/dL (ref 30.0–36.0)
MCV: 91.6 fL (ref 80.0–100.0)
Monocytes Absolute: 0.5 10*3/uL (ref 0.1–1.0)
Monocytes Relative: 6 %
Neutro Abs: 5.8 10*3/uL (ref 1.7–7.7)
Neutrophils Relative %: 77 %
Platelets: 245 10*3/uL (ref 150–400)
RBC: 4.54 MIL/uL (ref 4.22–5.81)
RDW: 12.8 % (ref 11.5–15.5)
WBC: 7.4 10*3/uL (ref 4.0–10.5)
nRBC: 0 % (ref 0.0–0.2)

## 2020-11-09 LAB — COMPREHENSIVE METABOLIC PANEL
ALT: 14 U/L (ref 0–44)
AST: 14 U/L — ABNORMAL LOW (ref 15–41)
Albumin: 4 g/dL (ref 3.5–5.0)
Alkaline Phosphatase: 63 U/L (ref 38–126)
Anion gap: 10 (ref 5–15)
BUN: 17 mg/dL (ref 8–23)
CO2: 27 mmol/L (ref 22–32)
Calcium: 9.1 mg/dL (ref 8.9–10.3)
Chloride: 99 mmol/L (ref 98–111)
Creatinine, Ser: 0.94 mg/dL (ref 0.61–1.24)
GFR, Estimated: >60 mL/min (ref >60)
Glucose, Bld: 130 mg/dL — ABNORMAL HIGH (ref 70–99)
Potassium: 4.2 mmol/L (ref 3.5–5.1)
Sodium: 136 mmol/L (ref 135–145)
Total Bilirubin: 1.4 mg/dL — ABNORMAL HIGH (ref 0.3–1.2)
Total Protein: 6.6 g/dL (ref 6.5–8.1)

## 2020-11-14 LAB — PROSTATE-SPECIFIC AG, SERUM (LABCORP): Prostate Specific Ag, Serum: <0.1 ng/mL (ref 0.0–4.0)

## 2020-11-28 ENCOUNTER — Other Ambulatory Visit: Payer: Self-pay

## 2020-11-28 ENCOUNTER — Ambulatory Visit
Admission: RE | Admit: 2020-11-28 | Discharge: 2020-11-28 | Disposition: A | Discharge status: Home / Self Care | Payer: Medicare Other | Source: Ambulatory Visit | Attending: Oncology | Admitting: Oncology

## 2020-11-28 DIAGNOSIS — M8580 Other specified disorders of bone density and structure, unspecified site: Secondary | ICD-10-CM

## 2020-11-28 DIAGNOSIS — M8589 Other specified disorders of bone density and structure, multiple sites: Secondary | ICD-10-CM | POA: Insufficient documentation

## 2021-01-04 ENCOUNTER — Encounter: Payer: Medicare Other | Admitting: Dermatology

## 2021-01-08 ENCOUNTER — Encounter: Payer: Medicare Other | Admitting: Dermatology

## 2021-01-08 ENCOUNTER — Other Ambulatory Visit: Payer: Self-pay

## 2021-01-08 ENCOUNTER — Ambulatory Visit (INDEPENDENT_AMBULATORY_CARE_PROVIDER_SITE_OTHER): Payer: Medicare Other | Admitting: Dermatology

## 2021-01-08 DIAGNOSIS — L720 Epidermal cyst: Secondary | ICD-10-CM

## 2021-01-08 DIAGNOSIS — D1800 Hemangioma unspecified site: Secondary | ICD-10-CM

## 2021-01-08 DIAGNOSIS — L578 Other skin changes due to chronic exposure to nonionizing radiation: Secondary | ICD-10-CM

## 2021-01-08 DIAGNOSIS — Z86018 Personal history of other benign neoplasm: Secondary | ICD-10-CM

## 2021-01-08 DIAGNOSIS — L821 Other seborrheic keratosis: Secondary | ICD-10-CM

## 2021-01-08 DIAGNOSIS — Z1283 Encounter for screening for malignant neoplasm of skin: Secondary | ICD-10-CM

## 2021-01-08 DIAGNOSIS — L820 Inflamed seborrheic keratosis: Secondary | ICD-10-CM | POA: Diagnosis not present

## 2021-01-08 DIAGNOSIS — D692 Other nonthrombocytopenic purpura: Secondary | ICD-10-CM

## 2021-01-08 DIAGNOSIS — D229 Melanocytic nevi, unspecified: Secondary | ICD-10-CM

## 2021-01-08 DIAGNOSIS — L814 Other melanin hyperpigmentation: Secondary | ICD-10-CM

## 2021-01-08 NOTE — Patient Instructions (Signed)

## 2021-01-08 NOTE — Progress Notes (Signed)
Follow-Up Visit   Subjective  Brandon Shelton is a 75 y.o. male who presents for the following: Total body skin exam (Hx of Dysplastic nevi). The patient presents for Total-Body Skin Exam (TBSE) for skin cancer screening and mole check.  The following portions of the chart were reviewed this encounter and updated as appropriate:   Tobacco  Allergies  Meds  Problems  Med Hx  Surg Hx  Fam Hx     Review of Systems:  No other skin or systemic complaints except as noted in HPI or Assessment and Plan.  Objective  Well appearing patient in no apparent distress; mood and affect are within normal limits.  A full examination was performed including scalp, head, eyes, ears, nose, lips, neck, chest, axillae, abdomen, back, buttocks, bilateral upper extremities, bilateral lower extremities, hands, feet, fingers, toes, fingernails, and toenails. All findings within normal limits unless otherwise noted below.  multiple Scars with no evidence of recurrence.   R medial clavicle x 1, scalp x 1, Totoal = 2 (2) Erythematous keratotic or waxy stuck-on papule or plaque.   Right Lower Lip Cystic pap   Assessment & Plan   Lentigines - Scattered tan macules - Due to sun exposure - Benign-appering, observe - Recommend daily broad spectrum sunscreen SPF 30+ to sun-exposed areas, reapply every 2 hours as needed. - Call for any changes  Seborrheic Keratoses - Stuck-on, waxy, tan-brown papules and/or plaques  - Benign-appearing - Discussed benign etiology and prognosis. - Observe - Call for any changes  Melanocytic Nevi - Tan-brown and/or pink-flesh-colored symmetric macules and papules - Benign appearing on exam today - Observation - Call clinic for new or changing moles - Recommend daily use of broad spectrum spf 30+ sunscreen to sun-exposed areas.   Hemangiomas - Red papules - Discussed benign nature - Observe - Call for any changes  Actinic Damage - Chronic condition,  secondary to cumulative UV/sun exposure - diffuse scaly erythematous macules with underlying dyspigmentation - Recommend daily broad spectrum sunscreen SPF 30+ to sun-exposed areas, reapply every 2 hours as needed.  - Staying in the shade or wearing long sleeves, sun glasses (UVA+UVB protection) and wide brim hats (4-inch brim around the entire circumference of the hat) are also recommended for sun protection.  - Call for new or changing lesions.  Purpura - Chronic; persistent and recurrent.  Treatable, but not curable. - Violaceous macules and patches - Benign - Related to trauma, age, sun damage and/or use of blood thinners, chronic use of topical and/or oral steroids - Observe - Can use OTC arnica containing moisturizer such as Dermend Bruise Formula if desired - Call for worsening or other concerns  Skin cancer screening performed today.  History of dysplastic nevus multiple  Clear. Observe for recurrence. Call clinic for new or changing lesions.  Recommend regular skin exams, daily broad-spectrum spf 30+ sunscreen use, and photoprotection.    Inflamed seborrheic keratosis R medial clavicle x 1, scalp x 1, Totoal = 2  Destruction of lesion - R medial clavicle x 1, scalp x 1, Totoal = 2 Complexity: simple   Destruction method: cryotherapy   Informed consent: discussed and consent obtained   Timeout:  patient name, date of birth, surgical site, and procedure verified Lesion destroyed using liquid nitrogen: Yes   Region frozen until ice ball extended beyond lesion: Yes   Outcome: patient tolerated procedure well with no complications   Post-procedure details: wound care instructions given    Epidermal cyst Right Lower Lip  Benign, discussed excising, pt declines  Skin cancer screening  Return in about 1 year (around 01/08/2022) for TBSE, Hx of Dysplastic nevi.  I, Othelia Pulling, RMA, am acting as scribe for Sarina Ser, MD . Documentation: I have reviewed the above  documentation for accuracy and completeness, and I agree with the above.  Sarina Ser, MD

## 2021-01-09 ENCOUNTER — Encounter: Payer: Self-pay | Admitting: Dermatology

## 2021-01-15 ENCOUNTER — Other Ambulatory Visit: Payer: Self-pay | Admitting: Oncology

## 2021-01-15 DIAGNOSIS — C61 Malignant neoplasm of prostate: Secondary | ICD-10-CM

## 2021-02-06 ENCOUNTER — Other Ambulatory Visit: Payer: Self-pay

## 2021-02-06 DIAGNOSIS — C61 Malignant neoplasm of prostate: Secondary | ICD-10-CM

## 2021-02-07 ENCOUNTER — Inpatient Hospital Stay: Payer: Medicare Other | Attending: Oncology

## 2021-02-07 ENCOUNTER — Other Ambulatory Visit: Payer: Self-pay

## 2021-02-07 DIAGNOSIS — C61 Malignant neoplasm of prostate: Secondary | ICD-10-CM | POA: Insufficient documentation

## 2021-02-07 LAB — CBC WITH DIFFERENTIAL/PLATELET
Abs Immature Granulocytes: 0.07 10*3/uL (ref 0.00–0.07)
Basophils Absolute: 0.1 10*3/uL (ref 0.0–0.1)
Basophils Relative: 1 %
Eosinophils Absolute: 0.4 10*3/uL (ref 0.0–0.5)
Eosinophils Relative: 5 %
HCT: 42.6 % (ref 39.0–52.0)
Hemoglobin: 14.4 g/dL (ref 13.0–17.0)
Immature Granulocytes: 1 %
Lymphocytes Relative: 9 %
Lymphs Abs: 0.6 10*3/uL — ABNORMAL LOW (ref 0.7–4.0)
MCH: 31.4 pg (ref 26.0–34.0)
MCHC: 33.8 g/dL (ref 30.0–36.0)
MCV: 92.8 fL (ref 80.0–100.0)
Monocytes Absolute: 0.4 10*3/uL (ref 0.1–1.0)
Monocytes Relative: 5 %
Neutro Abs: 5.3 10*3/uL (ref 1.7–7.7)
Neutrophils Relative %: 79 %
Platelets: 224 10*3/uL (ref 150–400)
RBC: 4.59 MIL/uL (ref 4.22–5.81)
RDW: 13.2 % (ref 11.5–15.5)
WBC: 6.8 10*3/uL (ref 4.0–10.5)
nRBC: 0 % (ref 0.0–0.2)

## 2021-02-07 LAB — COMPREHENSIVE METABOLIC PANEL
ALT: 13 U/L (ref 0–44)
AST: 16 U/L (ref 15–41)
Albumin: 4 g/dL (ref 3.5–5.0)
Alkaline Phosphatase: 62 U/L (ref 38–126)
Anion gap: 8 (ref 5–15)
BUN: 16 mg/dL (ref 8–23)
CO2: 28 mmol/L (ref 22–32)
Calcium: 9.4 mg/dL (ref 8.9–10.3)
Chloride: 100 mmol/L (ref 98–111)
Creatinine, Ser: 0.86 mg/dL (ref 0.61–1.24)
GFR, Estimated: >60 mL/min (ref >60)
Glucose, Bld: 142 mg/dL — ABNORMAL HIGH (ref 70–99)
Potassium: 4.2 mmol/L (ref 3.5–5.1)
Sodium: 136 mmol/L (ref 135–145)
Total Bilirubin: 1.2 mg/dL (ref 0.3–1.2)
Total Protein: 6.9 g/dL (ref 6.5–8.1)

## 2021-02-07 LAB — PSA: Prostatic Specific Antigen: <0.01 ng/mL (ref 0.00–4.00)

## 2021-02-08 ENCOUNTER — Telehealth: Payer: Medicare Other | Admitting: Oncology

## 2021-02-09 NOTE — Progress Notes (Signed)
[place]  Telephone:(336) [phone] Fax:(336) [phone]  ID: REI HABERKORN OB: 01-30-1946  MR#: [id number]  [id number]  Patient Care Team: Juluis Pitch, MD as PCP - General (Family Medicine) [name], MD as Consulting Physician (Hematology and Oncology)  I connected with [name] on 02/15/21 at  3:30 PM EDT by video enabled telemedicine visit and verified that I am speaking with the correct person using two identifiers.   I discussed the limitations, risks, security and privacy concerns of performing an evaluation and management service by telemedicine and the availability of in-person appointments. I also discussed with the patient that there may be a patient responsible charge related to this service. The patient expressed understanding and agreed to proceed.   Other persons participating in the visit and their role in the encounter: Patient, MD.  Patient's location: Home. Provider's location: Clinic.  CHIEF COMPLAINT: Prostate cancer  INTERVAL HISTORY: Patient agreed to video assisted telemedicine visit for repeat laboratory can routine 32-monthevaluation.  He continues to feel well and remains asymptomatic.  He continues to tolerate Zytiga and prednisone without significant side effects. He has no neurologic complaints. He denies any recent fevers or illnesses. He has a good appetite and denies weight loss.  He denies any chest pain, shortness of breath, cough, or hemoptysis. He denies any nausea, vomiting, constipation, or diarrhea. He has no urinary complaints.  Patient offers no specific complaints today.  REVIEW OF SYSTEMS:   Review of Systems  Constitutional: Negative.  Negative for fever, malaise/fatigue and weight loss.  Respiratory: Negative.  Negative for cough and shortness of breath.   Cardiovascular: Negative.  Negative for chest pain and leg swelling.  Gastrointestinal: Negative.  Negative for abdominal pain, nausea and  vomiting.  Genitourinary: Negative.  Negative for frequency.  Musculoskeletal: Negative.  Negative for back pain.  Skin: Negative.  Negative for rash.  Neurological: Negative.  Negative for focal weakness, weakness and headaches.  Endo/Heme/Allergies:  Does not bruise/bleed easily.  Psychiatric/Behavioral: Negative.  The patient is not nervous/anxious.    As per HPI. Otherwise, a complete review of systems is negative.  PAST MEDICAL HISTORY: Past Medical History:  Diagnosis Date   Atypical mole 12/30/2013   L distal lat thigh/mild   Atypical mole 08/06/2017   R super pubic/mild   Atypical mole 10/07/2017   R inf medial scapula/excision   Atypical mole 02/04/2018   R ant deltoid sup/mod   Atypical mole 06/02/2018   R ant deltoid inf/excision   Atypical mole 02/17/2019   R medial pectoral/mild   Bradycardia    Diabetes mellitus without complication (HCC)    High cholesterol    Osteopenia    Palpitations    Paroxysmal supraventricular tachycardia (HCC)    Prostate cancer (HCC)    history of prostate cancer    PAST SURGICAL HISTORY: Prostatectomy.  FAMILY HISTORY: Reviewed and unchanged. No reported history of malignancy or chronic disease.     ADVANCED DIRECTIVES:    HEALTH MAINTENANCE: Social History   Tobacco Use   Smoking status: Former    Types: Cigarettes    Quit date: 06/24/1972    Years since quitting: 48.6   Smokeless tobacco: Never  Vaping Use   Vaping Use: Never used  Substance Use Topics   Alcohol use: No   Drug use: No     Colonoscopy:  PAP:  Bone density:  Lipid panel:  No Known Allergies  Current Outpatient Medications  Medication Sig Dispense Refill  abiraterone acetate (ZYTIGA) 250 MG tablet TAKE 4 TABLETS DAILY ON AN EMPTY STOMACH 1 HOUR BEFORE OR 2 HOURS AFTER A MEAL 120 tablet 5   atorvastatin (LIPITOR) 20 MG tablet TAKE 1/2 TABLET ONCE DAILY     calcium-vitamin D (OSCAL-500) 500-400 MG-UNIT tablet Take by mouth 2 (two) times daily.       diltiazem (CARDIZEM CD) 180 MG 24 hr capsule Take 240 mg by mouth daily.     predniSONE (DELTASONE) 5 MG tablet Take 0.5 tablets (2.5 mg total) by mouth every 12 (twelve) hours. 90 tablet 0   No current facility-administered medications for this visit.    OBJECTIVE: There were no vitals filed for this visit.   There is no height or weight on file to calculate BMI.    ECOG FS:0 - Asymptomatic  General: Well-developed, well-nourished, no acute distress. HEENT: Normocephalic. Neuro: Alert, answering all questions appropriately. Cranial nerves grossly intact. Psych: Normal affect.   LAB RESULTS:  Lab Results  Component Value Date   NA 136 02/07/2021   K 4.2 02/07/2021   CL 100 02/07/2021   CO2 28 02/07/2021   GLUCOSE 142 (H) 02/07/2021   BUN 16 02/07/2021   CREATININE 0.86 02/07/2021   CALCIUM 9.4 02/07/2021   PROT 6.9 02/07/2021   ALBUMIN 4.0 02/07/2021   AST 16 02/07/2021   ALT 13 02/07/2021   ALKPHOS 62 02/07/2021   BILITOT 1.2 02/07/2021   GFRNONAA >60 02/07/2021   GFRAA >60 01/31/2020    Lab Results  Component Value Date   WBC 6.8 02/07/2021   NEUTROABS 5.3 02/07/2021   HGB 14.4 02/07/2021   HCT 42.6 02/07/2021   MCV 92.8 02/07/2021   PLT 224 02/07/2021   Lab Results  Component Value Date   PSA <0.01 10/29/2016     STUDIES: No results found.  ASSESSMENT: Prostate cancer  PLAN:    1. Prostate cancer: No evidence of disease.  Patient's PSA continues to remain undetectable at less than 0.01.  His PSA has been undetectable since at least 2013.  Continue 1000 mg Zytiga and 2.5 mg of prednisone twice per day.  Patient continues to have no interest in discontinuing treatment.  Return to clinic in 3 months for laboratory work only and then in 6 months with laboratory work and video assisted telemedicine visit.   2. Elevated bilirubin: Resolved. 3.  Osteopenia: Patient's bone mineral density on November 28, 2020 reported T score of -1.3 which is slightly worse  than 2 years prior.  Continue calcium and vitamin D supplementation.  Repeat in June 2023.    I provided 20 minutes of face-to-face video visit time during this encounter which included chart review, counseling, and coordination of care as documented above.   Patient expressed understanding and was in agreement with this plan. He also understands that He can call clinic at any time with any questions, concerns, or complaints.   Cancer Staging Cancer of prostate [facility]) Staging form: Prostate, AJCC 7th Edition - Clinical: Stage IV (M1, Gleason <= 6) - Signed by Evlyn Kanner, NP on 04/19/2015 Gleason score: 0   [name], MD   02/15/2021 3:44 PM

## 2021-02-13 ENCOUNTER — Telehealth: Payer: Medicare Other | Admitting: Oncology

## 2021-02-14 ENCOUNTER — Other Ambulatory Visit: Payer: Self-pay

## 2021-02-14 DIAGNOSIS — C61 Malignant neoplasm of prostate: Secondary | ICD-10-CM

## 2021-02-14 MED ORDER — ABIRATERONE ACETATE 250 MG PO TABS: ORAL_TABLET | ORAL | 5 refills | Status: DC

## 2021-02-15 ENCOUNTER — Inpatient Hospital Stay (HOSPITAL_BASED_OUTPATIENT_CLINIC_OR_DEPARTMENT_OTHER): Payer: Medicare Other | Admitting: Oncology

## 2021-02-15 ENCOUNTER — Encounter: Payer: Self-pay | Admitting: Oncology

## 2021-02-15 DIAGNOSIS — C61 Malignant neoplasm of prostate: Secondary | ICD-10-CM | POA: Diagnosis not present

## 2021-02-21 ENCOUNTER — Ambulatory Visit (INDEPENDENT_AMBULATORY_CARE_PROVIDER_SITE_OTHER): Payer: Medicare Other | Admitting: Dermatology

## 2021-02-21 ENCOUNTER — Other Ambulatory Visit: Payer: Self-pay

## 2021-02-21 DIAGNOSIS — L720 Epidermal cyst: Secondary | ICD-10-CM

## 2021-02-21 DIAGNOSIS — L820 Inflamed seborrheic keratosis: Secondary | ICD-10-CM

## 2021-02-21 DIAGNOSIS — L578 Other skin changes due to chronic exposure to nonionizing radiation: Secondary | ICD-10-CM

## 2021-02-21 NOTE — Patient Instructions (Addendum)

## 2021-02-21 NOTE — Progress Notes (Deleted)
Follow-Up Visit   Subjective  Brandon Shelton is a 75 y.o. male who presents for the following: Skin Problem (Check spot on the right arm growing and changing ) and Cyst (Check cyst on the right lower lip, pt would like this cyst removed today ).  The following portions of the chart were reviewed this encounter and updated as appropriate:   Tobacco  Allergies  Meds  Problems  Med Hx  Surg Hx  Fam Hx     Review of Systems:  No other skin or systemic complaints except as noted in HPI or Assessment and Plan.  Objective  Well appearing patient in no apparent distress; mood and affect are within normal limits.  A focused examination was performed including face, lip,right arm. Relevant physical exam findings are noted in the Assessment and Plan.  Right Forearm - Anterior (2) Erythematous keratotic or waxy stuck-on papule or plaque.   right lower lip 0.6 cm Subcutaneous nodule.    Assessment & Plan      Follow-Up Visit   Subjective  Brandon Shelton is a 75 y.o. male who presents for the following: Skin Problem (Check spot on the right arm growing and changing ) and Cyst (Check cyst on the right lower lip, pt would like this cyst removed today ).    The following portions of the chart were reviewed this encounter and updated as appropriate:   Tobacco  Allergies  Meds  Problems  Med Hx  Surg Hx  Fam Hx      Review of Systems:  No other skin or systemic complaints except as noted in HPI or Assessment and Plan.  Objective  Well appearing patient in no apparent distress; mood and affect are within normal limits.  A focused examination was performed including face,right arm. Relevant physical exam findings are noted in the Assessment and Plan.  Right Forearm - Anterior (2) Erythematous keratotic or waxy stuck-on papule or plaque.   right lower lip 0.6 cm Subcutaneous nodule.    Assessment & Plan  Inflamed seborrheic keratosis Right Forearm -  Anterior  Destruction of lesion - Right Forearm - Anterior Complexity: simple   Destruction method: cryotherapy   Informed consent: discussed and consent obtained   Timeout:  patient name, date of birth, surgical site, and procedure verified Lesion destroyed using liquid nitrogen: Yes   Region frozen until ice ball extended beyond lesion: Yes   Outcome: patient tolerated procedure well with no complications   Post-procedure details: wound care instructions given    Epidermal inclusion cyst right lower lip  Skin excision - right lower lip  Total excision diameter (cm):  0.6 Informed consent: discussed and consent obtained   Timeout: patient name, date of birth, surgical site, and procedure verified   Procedure prep:  Patient was prepped and draped in usual sterile fashion Prep type:  Povidone-iodine Anesthesia: the lesion was anesthetized in a standard fashion   Anesthetic:  1% lidocaine w/ epinephrine 1-100,000 buffered w/ 8.4% NaHCO3 Instrument used: #11 blade   Outcome: patient tolerated procedure well with no complications    Seborrheic Keratoses - Stuck-on, waxy, tan-brown papules and/or plaques  - Benign-appearing - Discussed benign etiology and prognosis. - Observe - Call for any changes  Actinic Damage - chronic, secondary to cumulative UV radiation exposure/sun exposure over time - diffuse scaly erythematous macules with underlying dyspigmentation - Recommend daily broad spectrum sunscreen SPF 30+ to sun-exposed areas, reapply every 2 hours as needed.  - Recommend staying in  the shade or wearing long sleeves, sun glasses (UVA+UVB protection) and wide brim hats (4-inch brim around the entire circumference of the hat). - Call for new or changing lesions.     Return if symptoms worsen or fail to improve.  IMarye Round, CMA, am acting as scribe for Sarina Ser, MD .

## 2021-02-22 ENCOUNTER — Encounter: Payer: Self-pay | Admitting: Dermatology

## 2021-02-28 NOTE — Progress Notes (Signed)
   Follow-Up Visit   Subjective  Brandon Shelton is a 75 y.o. male who presents for the following: Skin Problem (Check spot on the right arm growing and changing ) and Cyst (Check cyst on the right lower lip, pt would like this cyst removed today ).  The following portions of the chart were reviewed this encounter and updated as appropriate:   Tobacco  Allergies  Meds  Problems  Med Hx  Surg Hx  Fam Hx     Review of Systems:  No other skin or systemic complaints except as noted in HPI or Assessment and Plan.  Objective  Well appearing patient in no apparent distress; mood and affect are within normal limits.  A focused examination was performed including right arm, lip. Relevant physical exam findings are noted in the Assessment and Plan.  Right Forearm - Anterior (2) Erythematous keratotic or waxy stuck-on papule or plaque.   right lower lip 0.6 cm Subcutaneous nodule.    Assessment & Plan  Inflamed seborrheic keratosis Right Forearm - Anterior  Destruction of lesion - Right Forearm - Anterior Complexity: simple   Destruction method: cryotherapy   Informed consent: discussed and consent obtained   Timeout:  patient name, date of birth, surgical site, and procedure verified Lesion destroyed using liquid nitrogen: Yes   Region frozen until ice ball extended beyond lesion: Yes   Outcome: patient tolerated procedure well with no complications   Post-procedure details: wound care instructions given    Epidermal inclusion cyst right lower lip  Skin excision - right lower lip  Total excision diameter (cm):  0.6 Informed consent: discussed and consent obtained   Timeout: patient name, date of birth, surgical site, and procedure verified   Procedure prep:  Patient was prepped and draped in usual sterile fashion Prep type:  Povidone-iodine Anesthesia: the lesion was anesthetized in a standard fashion   Anesthetic:  1% lidocaine w/ epinephrine 1-100,000 buffered w/ 8.4%  NaHCO3 Instrument used: #11 blade   Outcome: patient tolerated procedure well with no complications    Actinic Damage - chronic, secondary to cumulative UV radiation exposure/sun exposure over time - diffuse scaly erythematous macules with underlying dyspigmentation - Recommend daily broad spectrum sunscreen SPF 30+ to sun-exposed areas, reapply every 2 hours as needed.  - Recommend staying in the shade or wearing long sleeves, sun glasses (UVA+UVB protection) and wide brim hats (4-inch brim around the entire circumference of the hat). - Call for new or changing lesions.  Return if symptoms worsen or fail to improve.  IMarye Round, CMA, am acting as scribe for Sarina Ser, MD .  Documentation: I have reviewed the above documentation for accuracy and completeness, and I agree with the above.  Sarina Ser, MD

## 2021-04-02 ENCOUNTER — Other Ambulatory Visit: Payer: Self-pay | Admitting: Oncology

## 2021-04-02 DIAGNOSIS — C61 Malignant neoplasm of prostate: Secondary | ICD-10-CM

## 2021-05-22 ENCOUNTER — Other Ambulatory Visit: Payer: Self-pay | Admitting: *Deleted

## 2021-05-22 DIAGNOSIS — C61 Malignant neoplasm of prostate: Secondary | ICD-10-CM

## 2021-05-23 ENCOUNTER — Inpatient Hospital Stay: Payer: Medicare Other | Attending: Oncology

## 2021-05-23 ENCOUNTER — Other Ambulatory Visit: Payer: Self-pay

## 2021-05-23 DIAGNOSIS — C61 Malignant neoplasm of prostate: Secondary | ICD-10-CM | POA: Diagnosis present

## 2021-05-23 LAB — PSA: Prostatic Specific Antigen: <0.01 ng/mL (ref 0.00–4.00)

## 2021-05-23 LAB — CBC WITH DIFFERENTIAL/PLATELET
Abs Immature Granulocytes: 0.05 10*3/uL (ref 0.00–0.07)
Basophils Absolute: 0 10*3/uL (ref 0.0–0.1)
Basophils Relative: 1 %
Eosinophils Absolute: 0.4 10*3/uL (ref 0.0–0.5)
Eosinophils Relative: 8 %
HCT: 41.8 % (ref 39.0–52.0)
Hemoglobin: 13.9 g/dL (ref 13.0–17.0)
Immature Granulocytes: 1 %
Lymphocytes Relative: 21 %
Lymphs Abs: 0.9 10*3/uL (ref 0.7–4.0)
MCH: 31.4 pg (ref 26.0–34.0)
MCHC: 33.3 g/dL (ref 30.0–36.0)
MCV: 94.4 fL (ref 80.0–100.0)
Monocytes Absolute: 0.3 10*3/uL (ref 0.1–1.0)
Monocytes Relative: 6 %
Neutro Abs: 2.6 10*3/uL (ref 1.7–7.7)
Neutrophils Relative %: 63 %
Platelets: 225 10*3/uL (ref 150–400)
RBC: 4.43 MIL/uL (ref 4.22–5.81)
RDW: 12.6 % (ref 11.5–15.5)
WBC: 4.2 10*3/uL (ref 4.0–10.5)
nRBC: 0 % (ref 0.0–0.2)

## 2021-05-23 LAB — COMPREHENSIVE METABOLIC PANEL
ALT: 15 U/L (ref 0–44)
AST: 17 U/L (ref 15–41)
Albumin: 3.9 g/dL (ref 3.5–5.0)
Alkaline Phosphatase: 59 U/L (ref 38–126)
Anion gap: 8 (ref 5–15)
BUN: 18 mg/dL (ref 8–23)
CO2: 27 mmol/L (ref 22–32)
Calcium: 8.7 mg/dL — ABNORMAL LOW (ref 8.9–10.3)
Chloride: 102 mmol/L (ref 98–111)
Creatinine, Ser: 0.72 mg/dL (ref 0.61–1.24)
GFR, Estimated: >60 mL/min (ref >60)
Glucose, Bld: 194 mg/dL — ABNORMAL HIGH (ref 70–99)
Potassium: 4.2 mmol/L (ref 3.5–5.1)
Sodium: 137 mmol/L (ref 135–145)
Total Bilirubin: 1.2 mg/dL (ref 0.3–1.2)
Total Protein: 6.5 g/dL (ref 6.5–8.1)

## 2021-06-11 ENCOUNTER — Other Ambulatory Visit: Payer: Self-pay | Admitting: Oncology

## 2021-06-11 DIAGNOSIS — C61 Malignant neoplasm of prostate: Secondary | ICD-10-CM

## 2021-07-03 ENCOUNTER — Emergency Department
Admission: EM | Admit: 2021-07-03 | Discharge: 2021-07-03 | Disposition: A | Discharge status: Home / Self Care | Payer: Medicare Other | Attending: Emergency Medicine | Admitting: Emergency Medicine

## 2021-07-03 ENCOUNTER — Other Ambulatory Visit: Payer: Self-pay

## 2021-07-03 ENCOUNTER — Emergency Department: Payer: Medicare Other

## 2021-07-03 DIAGNOSIS — I10 Essential (primary) hypertension: Secondary | ICD-10-CM | POA: Insufficient documentation

## 2021-07-03 DIAGNOSIS — R002 Palpitations: Secondary | ICD-10-CM

## 2021-07-03 DIAGNOSIS — I471 Supraventricular tachycardia: Secondary | ICD-10-CM | POA: Diagnosis not present

## 2021-07-03 LAB — CBC
HCT: 42.4 % (ref 39.0–52.0)
Hemoglobin: 14.2 g/dL (ref 13.0–17.0)
MCH: 31.4 pg (ref 26.0–34.0)
MCHC: 33.5 g/dL (ref 30.0–36.0)
MCV: 93.8 fL (ref 80.0–100.0)
Platelets: 232 10*3/uL (ref 150–400)
RBC: 4.52 MIL/uL (ref 4.22–5.81)
RDW: 12.9 % (ref 11.5–15.5)
WBC: 7.2 10*3/uL (ref 4.0–10.5)
nRBC: 0 % (ref 0.0–0.2)

## 2021-07-03 LAB — T4, FREE: Free T4: 0.81 ng/dL (ref 0.61–1.12)

## 2021-07-03 LAB — TSH: TSH: 1.616 u[IU]/mL (ref 0.350–4.500)

## 2021-07-03 LAB — MAGNESIUM: Magnesium: 2.2 mg/dL (ref 1.7–2.4)

## 2021-07-03 LAB — BASIC METABOLIC PANEL
Anion gap: 9 (ref 5–15)
BUN: 17 mg/dL (ref 8–23)
CO2: 25 mmol/L (ref 22–32)
Calcium: 8.9 mg/dL (ref 8.9–10.3)
Chloride: 100 mmol/L (ref 98–111)
Creatinine, Ser: 0.75 mg/dL (ref 0.61–1.24)
GFR, Estimated: >60 mL/min (ref >60)
Glucose, Bld: 158 mg/dL — ABNORMAL HIGH (ref 70–99)
Potassium: 3.5 mmol/L (ref 3.5–5.1)
Sodium: 134 mmol/L — ABNORMAL LOW (ref 135–145)

## 2021-07-03 LAB — TROPONIN I (HIGH SENSITIVITY): Troponin I (High Sensitivity): 6 ng/L (ref <18)

## 2021-07-03 NOTE — ED Provider Notes (Signed)
[facility] Provider Note    Event Date/Time   First MD Initiated Contact with Patient 07/03/21 1521     (approximate)   History   Palpitations   HPI  Brandon Shelton is a 76 y.o. male with history of hypertension and SVT.   I reviewed cardiology's note from 10/23/2020 at [facility] by Dr. Nehemiah Massed.  Patient has history of SVT for many years.  He is on Cardizem.  Patient reports that he is on 240 mg once daily and takes the 30 mg as needed.  He denies any missed doses.  Patient reports that he has not had an episode of SVT for 2 years.  However today he started feeling the palpitations around 7 AM.  He stated that they would come and go.  He reports that he did not take his heart rate but when EMS got there his heart rate was 140.  He reports this feels similar to his prior SVT episodes.  He denies any chest pain, shortness of breath.  He states that just felt more like fatigue and weakness.  Denies any leg swelling.  Does have a history of prostate cancer but he reports that he has been on medication for many years and has been in remission with undetectable PSAs.  Patient denies any history of blood clots, leg swelling or any other concerns.  Patient reports that he did take the 30 mg of diltiazem around noon.  He states that he then called EMS.  With EMS transport he was in SVT but later broke into normal sinus without any interventions from them.  He reports feeling at his baseline self now   I did review the rhythm strip that appeared SVT    Physical Exam   Triage Vital Signs: ED Triage Vitals  Enc Vitals Group     BP 07/03/21 1409 109/66     Pulse Rate 07/03/21 1409 76     Resp 07/03/21 1409 16     Temp 07/03/21 1409 98.2 F (36.8 C)     Temp Source 07/03/21 1409 Oral     SpO2 07/03/21 1409 98 %     Weight 07/03/21 1406 160 lb (72.6 kg)     Height 07/03/21 1406 5\' 6"  (1.676 m)     Head Circumference --      Peak Flow --      Pain Score 07/03/21 1406 0      Pain Loc --      Pain Edu? --      Excl. in [place]? --     Most recent vital signs: Vitals:   07/03/21 1409  BP: 109/66  Pulse: 76  Resp: 16  Temp: 98.2 F (36.8 C)  SpO2: 98%     General: Awake, no distress.  Well-appearing male CV:  Good peripheral perfusion.  Normal rhythm, normal rate Resp:  Normal effort.  No increased work of breathing Abd:  No distention.  Nontender Other:  Legs without any evidence of edema   ED Results / Procedures / Treatments   Labs (all labs ordered are listed, but only abnormal results are displayed) Labs Reviewed  BASIC METABOLIC PANEL - Abnormal; Notable for the following components:      Result Value   Sodium 134 (*)    Glucose, Bld 158 (*)    All other components within normal limits  CBC  MAGNESIUM  TSH  T4, FREE  TROPONIN I (HIGH SENSITIVITY)     EKG  My  interpretation of EKG:  Normal sinus rate of 77 without any ST elevation or T wave inversions.  Has occasional PVCs.  Normal intervals  Repeat ekg normal sinus rate of 71, no st elevation, no twi, normal intervals  RADIOLOGY I have reviewed the xray personally and agree with radiology read No PNA, ptx   PROCEDURES:  Critical Care performed: No  Procedures   MEDICATIONS ORDERED IN ED: Medications - No data to display   IMPRESSION / MDM / [place] / ED COURSE  I reviewed the triage vital signs and the nursing notes.  Differential diagnosis includes, but is not limited to, SVT secondary to anemia, electrolyte abnormalities, thyroid dysfunction, chest x-ray to evaluate for any pneumonia, pneumothorax.  However given patient's prior history of SVT I suspect that this is most likely just related to his history of SVT.  I considered the possibility of PE given he has does have a history of prostate cancer but he is adamant that he has no shortness of breath, no leg swelling and that his cancer is currently undetectable so seems very unlikely in the setting of  patient having prior episodes of SVT.  I also considered the possibility of ACS although again no chest pain and patient had SVT episodes previously and is reported had reassuring stress test.  Patient's labs are all reassuring.  No evidence of anemia.  White count normal no evidence of infection.  Troponin is negative unlikely ACS given history of SVT and pt symptoms started at 7AM this morning would expect elevation if ACS. No signs of hyperthyroid or electrolyte abnormalities.    Patient is been in the ER for over 2 hours without recurrence of the SVT.  Did review the strips from EMS that did confirm SVT and then going back into normal sinus with EMS.  At this time patient feels comfortable with discharge home and will follow-up with cardiology to decide if he needs his medications adjusted.  He had another episode at home.  States he can take the 30 mg oral diltiazem and wait 1 to 2 hours however if he develops any return of severe chest pain or shortness of breath that he needs return to the ER immediately for repeat evaluation.  Patient expressed comfortable with this plan.  Encouraged him to avoid any caffeine or alcohol    FINAL CLINICAL IMPRESSION(S) / ED DIAGNOSES   Final diagnoses:  Palpitations  SVT (supraventricular tachycardia) ([place])     Rx / DC Orders   ED Discharge Orders     None        Note:  This document was prepared using Dragon voice recognition software and may include unintentional dictation errors.   Vanessa Republic, MD 07/03/21 [phone]

## 2021-07-03 NOTE — ED Triage Notes (Signed)
Pt here with palpitations. Pt is seeing a cardiologist for the same. Pt is also on medication, pt took at 1230. Pt HR was at 140 and then converted to SR with ems. Pt in NAD in triage.

## 2021-07-03 NOTE — Discharge Instructions (Signed)
Return to the ER if he develops continued symptoms and is not getting better after taking a small diltiazem.  Also return if you develop chest pain, shortness of breath, leg swelling or any other concerns

## 2021-08-07 ENCOUNTER — Other Ambulatory Visit: Payer: Self-pay | Admitting: Oncology

## 2021-08-07 DIAGNOSIS — C61 Malignant neoplasm of prostate: Secondary | ICD-10-CM

## 2021-08-07 NOTE — Telephone Encounter (Signed)
CBC Order: [id number] Status: Final result    Visible to patient: Yes (seen)    Next appt: 08/16/2021 at 11:30 AM in Oncology ([facility])    0 Result Notes           Component Ref Range & Units 1 mo ago (07/03/21) 2 mo ago (05/23/21) 6 mo ago (02/07/21) 9 mo ago (11/09/20) 12 mo ago (08/09/20) 1 yr ago (05/11/20) 1 yr ago (01/31/20)  WBC 4.0 - 10.5 K/uL 7.2  4.2  6.8  7.4  7.7  5.7  5.1   RBC 4.22 - 5.81 MIL/uL 4.52  4.43  4.59  4.54  4.83  4.63  4.45   Hemoglobin 13.0 - 17.0 g/dL 14.2  13.9  14.4  14.2  15.1  14.4  14.0   HCT 39.0 - 52.0 % 42.4  41.8  42.6  41.6  44.0  42.7  40.6   MCV 80.0 - 100.0 fL 93.8  94.4  92.8  91.6  91.1  92.2  91.2   MCH 26.0 - 34.0 pg 31.4  31.4  31.4  31.3  31.3  31.1  31.5   MCHC 30.0 - 36.0 g/dL 33.5  33.3  33.8  34.1  34.3  33.7  34.5   RDW 11.5 - 15.5 % 12.9  12.6  13.2  12.8  12.6  12.9  12.9   Platelets 150 - 400 K/uL 232  225  224  245  242  235  224   nRBC 0.0 - 0.2 % 0.0  0.0  0.0  0.0  0.0  0.0  0.0   Comment: Performed at [facility], [place], [place] [address]  Neutrophils Relative %   63 R  79 R  77 R  76 R  73 R  70 R   Basophils Absolute   0.0 R  0.1 R  0.0 R  0.1 R  0.1 R  0.1 R   Immature Granulocytes   1 R  1 R  1 R  1 R  1 R  1 R   Abs Immature Granulocytes   0.05 R, CM  0.07 R, CM  0.07 R, CM  0.08 High  R, CM  0.07 R, CM  0.05 R, CM   Neutro Abs   2.6 R  5.3 R  5.8 R  5.9 R  4.1 R  3.6 R   Lymphocytes Relative   21 R  9 R  9 R  10 R  13 R  17 R   Lymphs Abs   0.9 R  0.6 Low  R  0.6 Low  R  0.7 R  0.7 R  0.9 R   Monocytes Relative   6 R  5 R  6 R  7 R  7 R  5 R   Monocytes Absolute   0.3 R  0.4 R  0.5 R  0.5 R  0.4 R  0.3 R   Eosinophils Relative   8 R  5 R  6 R  5 R  5 R  6 R   Eosinophils Absolute   0.4 R  0.4 R  0.4 R  0.4 R  0.3 R  0.3 R   Basophils Relative   1 R  1 R  1 R  1 R  1 R  1 R   Resulting Agency  [place] [facility] [place] [facility] [place] [facility] [place] [facility] [place] [facility] [place] [facility]          Specimen Collected: 07/03/21 14:07 Last Resulted: 07/03/21 14:16      Magnesium Order: [id number] Status: Final result    Visible to patient: Yes (seen)    Next appt: 08/16/2021 at 11:30 AM in Oncology ([facility])    0 Result Notes      Component Ref Range & Units 1 mo ago 5 yr ago  Magnesium 1.7 - 2.4 mg/dL 2.2  2.4   Comment: Performed at [facility], [address] [place], [place] [address]  Resulting Agency  [facility]         Specimen Collected: 07/03/21 14:09 Last Resulted: 07/03/21 15:47      Lab Flowsheet    Order Details    View Encounter    Lab and Collection Details    Routing    Result History    View Encounter Conversation        Result Care Coordination   Patient Communication   Add Comments   Seen Back to Top         TSH Order: [id number] Status: Final result    Visible to patient: Yes (seen)    Next appt: 08/16/2021 at 11:30 AM in Oncology ([facility])    0 Result Notes      Component Ref Range & Units 1 mo ago 5 yr ago  TSH 0.350 - 4.500 uIU/mL 1.616  4.002 CM   Comment: Performed by a 3rd Generation assay with a functional sensitivity of <=0.01 uIU/mL.  Performed at [facility], [address] [place], [place] [address]   Resulting Agency  [facility]         Specimen Collected: 07/03/21 14:09 Last Resulted: 07/03/21 15:47      Lab Flowsheet    Order Details    View Encounter    Lab and Collection Details    Routing    Result History    View Encounter Conversation      CM=Additional comments      Result Care Coordination   Patient Communication   Add Comments   Seen Back to Top         T4, free Order: [id number] Status: Final result    Visible to patient: Yes (seen)    Next appt: 08/16/2021 at 11:30 AM in Oncology ([facility])    0 Result Notes     Component Ref Range & Units 1 mo ago  Free T4 0.61 - 1.12 ng/dL 0.81   Comment: (NOTE)  Biotin ingestion may  interfere with free T4 tests. If the results are  inconsistent with the TSH level, previous test results, or the  clinical presentation, then consider biotin interference. If needed,  order repeat testing after stopping biotin.  Performed at [facility], [address], [place],  [place] [address]   Resulting Agency  [facility]         Specimen Collected: 07/03/21 14:09 Last Resulted: 07/03/21 15:47      Lab Flowsheet    Order Details    View Encounter    Lab and Collection Details    Routing    Result History    View Encounter Conversation        Result Care Coordination   Patient Communication   Add Comments   Seen Back to Top          Contains abnormal data  Basic metabolic panel Order: [id number] Status: Final result    Visible to patient: Yes (seen)    Next appt: 08/16/2021 at 11:30 AM in Oncology ([facility])    0 Result Notes           Component Ref Range & Units 1 mo ago (07/03/21) 2 mo ago (05/23/21) 6 mo ago (02/07/21) 9 mo ago (11/09/20) 12 mo ago (08/09/20) 1 yr ago (05/11/20) 1 yr ago (01/31/20)  Sodium 135 - 145 mmol/L 134 Low   137  136  136  139  137  137   Potassium 3.5 - 5.1 mmol/L 3.5  4.2  4.2  4.2  4.2  4.5  4.4   Chloride 98 - 111 mmol/L 100  102  100  99  101  101  102   CO2 22 - 32 mmol/L 25  27  28  27  28  29  27    Glucose, Bld 70 - 99 mg/dL 158 High   194 High  CM  142 High  CM  130 High  CM  104 High  CM  99 CM  205 High  CM   Comment: Glucose reference range applies only to samples taken after fasting for at least 8 hours.  BUN 8 - 23 mg/dL 17  18  16  17  18  14  17    Creatinine, Ser 0.61 - 1.24 mg/dL 0.75  0.72  0.86  0.94  0.80  0.86  0.89   Calcium 8.9 - 10.3 mg/dL 8.9  8.7 Low   9.4  9.1  9.5  9.3  8.7 Low    GFR, Estimated >60 mL/min >60  >60 CM  >60 CM  >60 CM  >60 CM  >60 CM    Comment: (NOTE)  Calculated using the CKD-EPI Creatinine Equation (2021)   Anion gap 5 - 15 9  8  CM  8 CM  10 CM  10 CM  7 CM  8 CM    Comment: Performed at [facility], [address] [place], [place] [address]  Resulting Agency  [facility] [place] [facility] [place] [facility] [place] [facility] [place] [facility] [place] [facility] [place] [facility]         Specimen Collected: 07/03/21 14:07 Last Resulted: 07/03/21 14:21

## 2021-08-08 ENCOUNTER — Other Ambulatory Visit: Payer: Self-pay | Admitting: *Deleted

## 2021-08-08 DIAGNOSIS — C61 Malignant neoplasm of prostate: Secondary | ICD-10-CM

## 2021-08-16 ENCOUNTER — Inpatient Hospital Stay: Payer: Medicare Other | Attending: Oncology

## 2021-08-16 ENCOUNTER — Other Ambulatory Visit: Payer: Self-pay

## 2021-08-16 DIAGNOSIS — Z87891 Personal history of nicotine dependence: Secondary | ICD-10-CM | POA: Insufficient documentation

## 2021-08-16 DIAGNOSIS — R7303 Prediabetes: Secondary | ICD-10-CM | POA: Diagnosis not present

## 2021-08-16 DIAGNOSIS — Z79899 Other long term (current) drug therapy: Secondary | ICD-10-CM | POA: Diagnosis not present

## 2021-08-16 DIAGNOSIS — M25551 Pain in right hip: Secondary | ICD-10-CM | POA: Insufficient documentation

## 2021-08-16 DIAGNOSIS — M8580 Other specified disorders of bone density and structure, unspecified site: Secondary | ICD-10-CM | POA: Insufficient documentation

## 2021-08-16 DIAGNOSIS — C61 Malignant neoplasm of prostate: Secondary | ICD-10-CM | POA: Diagnosis not present

## 2021-08-16 LAB — COMPREHENSIVE METABOLIC PANEL
ALT: 13 U/L (ref 0–44)
AST: 15 U/L (ref 15–41)
Albumin: 3.9 g/dL (ref 3.5–5.0)
Alkaline Phosphatase: 67 U/L (ref 38–126)
Anion gap: 5 (ref 5–15)
BUN: 16 mg/dL (ref 8–23)
CO2: 29 mmol/L (ref 22–32)
Calcium: 8.9 mg/dL (ref 8.9–10.3)
Chloride: 102 mmol/L (ref 98–111)
Creatinine, Ser: 0.95 mg/dL (ref 0.61–1.24)
GFR, Estimated: >60 mL/min (ref >60)
Glucose, Bld: 89 mg/dL (ref 70–99)
Potassium: 4.1 mmol/L (ref 3.5–5.1)
Sodium: 136 mmol/L (ref 135–145)
Total Bilirubin: 0.7 mg/dL (ref 0.3–1.2)
Total Protein: 6.9 g/dL (ref 6.5–8.1)

## 2021-08-16 LAB — CBC WITH DIFFERENTIAL/PLATELET
Abs Immature Granulocytes: 0.04 10*3/uL (ref 0.00–0.07)
Basophils Absolute: 0.1 10*3/uL (ref 0.0–0.1)
Basophils Relative: 1 %
Eosinophils Absolute: 0.4 10*3/uL (ref 0.0–0.5)
Eosinophils Relative: 7 %
HCT: 43.7 % (ref 39.0–52.0)
Hemoglobin: 14.5 g/dL (ref 13.0–17.0)
Immature Granulocytes: 1 %
Lymphocytes Relative: 13 %
Lymphs Abs: 0.7 10*3/uL (ref 0.7–4.0)
MCH: 31 pg (ref 26.0–34.0)
MCHC: 33.2 g/dL (ref 30.0–36.0)
MCV: 93.4 fL (ref 80.0–100.0)
Monocytes Absolute: 0.4 10*3/uL (ref 0.1–1.0)
Monocytes Relative: 7 %
Neutro Abs: 4 10*3/uL (ref 1.7–7.7)
Neutrophils Relative %: 71 %
Platelets: 248 10*3/uL (ref 150–400)
RBC: 4.68 MIL/uL (ref 4.22–5.81)
RDW: 12.9 % (ref 11.5–15.5)
WBC: 5.7 10*3/uL (ref 4.0–10.5)
nRBC: 0 % (ref 0.0–0.2)

## 2021-08-16 LAB — PSA: Prostatic Specific Antigen: <0.01 ng/mL (ref 0.00–4.00)

## 2021-08-20 ENCOUNTER — Inpatient Hospital Stay (HOSPITAL_BASED_OUTPATIENT_CLINIC_OR_DEPARTMENT_OTHER): Payer: Medicare Other | Admitting: Nurse Practitioner

## 2021-08-20 ENCOUNTER — Other Ambulatory Visit: Payer: Self-pay

## 2021-08-20 ENCOUNTER — Encounter: Payer: Self-pay | Admitting: Nurse Practitioner

## 2021-08-20 VITALS — BP 142/65 | HR 61 | Temp 97.5°F | Wt 163.8 lb

## 2021-08-20 DIAGNOSIS — Z8546 Personal history of malignant neoplasm of prostate: Secondary | ICD-10-CM | POA: Diagnosis not present

## 2021-08-20 DIAGNOSIS — M85859 Other specified disorders of bone density and structure, unspecified thigh: Secondary | ICD-10-CM

## 2021-08-20 DIAGNOSIS — M898X9 Other specified disorders of bone, unspecified site: Secondary | ICD-10-CM

## 2021-08-20 DIAGNOSIS — M8580 Other specified disorders of bone density and structure, unspecified site: Secondary | ICD-10-CM

## 2021-08-20 DIAGNOSIS — Z08 Encounter for follow-up examination after completed treatment for malignant neoplasm: Secondary | ICD-10-CM | POA: Diagnosis not present

## 2021-08-20 DIAGNOSIS — C61 Malignant neoplasm of prostate: Secondary | ICD-10-CM

## 2021-08-20 DIAGNOSIS — Z79899 Other long term (current) drug therapy: Secondary | ICD-10-CM

## 2021-08-20 NOTE — Progress Notes (Signed)
Pt has been having pain on his lower back on the right side. Its intermittent but happens when he is sitting or standing. It can go from a 2 to 6 in pain depending on the day. It started a month ago.     Pt also had a question about his glucose. He normally runs on the higher end for years now and his last CMP his glucose was 89. Wife is concerned something is going on with his sugars.  Pt also has heart palpitations. Pt see's Cardiology at [facility]. Pt said that it was 4 hard palpation back to back.  Wife is worried that his decrease in sugars could be the reason for the bp increase and the Heart Palps.

## 2021-08-20 NOTE — Progress Notes (Signed)
[place]  [phone] [phone]  ID: Brandon Shelton OB: Nov 07, 1945  MR#: [id number]  IWO#:[id number]  Patient Care Team: Juluis Pitch, MD as PCP - General (Family Medicine) [name], MD as Consulting Physician (Hematology and Oncology)  CHIEF COMPLAINT: Prostate cancer  INTERVAL HISTORY: Patient retuns to clinic for discussion of lab results and routine 6 month evaluation. He continues to feel well. He complains of some right hip pain which started after a car ride and has been improving. Has been taking aleve. Tolerating zytiga and prednisone well. Is concerned about his blood sugars. He has no neurologic complaints. He denies any recent fevers or illnesses. He has a good appetite and denies weight loss.  He denies any chest pain, shortness of breath, cough, or hemoptysis. He denies any nausea, vomiting, constipation, or diarrhea. He has no urinary complaints.  Patient offers no specific complaints today.  REVIEW OF SYSTEMS:   Review of Systems  Constitutional: Negative.  Negative for fever, malaise/fatigue and weight loss.  Respiratory: Negative.  Negative for cough and shortness of breath.   Cardiovascular: Negative.  Negative for chest pain and leg swelling.  Gastrointestinal: Negative.  Negative for abdominal pain, nausea and vomiting.  Genitourinary: Negative.  Negative for frequency.  Musculoskeletal: Negative.  Negative for back pain.  Skin: Negative.  Negative for rash.  Neurological: Negative.  Negative for focal weakness, weakness and headaches.  Endo/Heme/Allergies:  Does not bruise/bleed easily.  Psychiatric/Behavioral: Negative.  The patient is not nervous/anxious.    As per HPI. Otherwise, a complete review of systems is negative.  PAST MEDICAL HISTORY: Past Medical History:  Diagnosis Date   Atypical mole 12/30/2013   L distal lat thigh/mild   Atypical mole 08/06/2017   R super pubic/mild   Atypical mole  10/07/2017   R inf medial scapula/excision   Atypical mole 02/04/2018   R ant deltoid sup/mod   Atypical mole 06/02/2018   R ant deltoid inf/excision   Atypical mole 02/17/2019   R medial pectoral/mild   Bradycardia    Diabetes mellitus without complication (HCC)    High cholesterol    Osteopenia    Palpitations    Paroxysmal supraventricular tachycardia (HCC)    Prostate cancer (HCC)    history of prostate cancer    PAST SURGICAL HISTORY: Prostatectomy.  FAMILY HISTORY: Reviewed and unchanged. No reported history of malignancy or chronic disease.     ADVANCED DIRECTIVES:    HEALTH MAINTENANCE: Social History   Tobacco Use   Smoking status: Former    Types: Cigarettes    Quit date: 06/24/1972    Years since quitting: 49.1   Smokeless tobacco: Never  Vaping Use   Vaping Use: Never used  Substance Use Topics   Alcohol use: No   Drug use: No     Colonoscopy: 01/2019  Bone density: June 2022  Lipid panel: per pcp  No Known Allergies  Current Outpatient Medications  Medication Sig Dispense Refill   abiraterone acetate (ZYTIGA) 250 MG tablet TAKE 4 TABLETS DAILY ON AN EMPTY STOMACH 1 HOUR BEFORE OR 2 HOURS AFTER A MEAL 120 tablet 5   atorvastatin (LIPITOR) 20 MG tablet TAKE 1/2 TABLET ONCE DAILY     calcium-vitamin D (OSCAL-500) 500-400 MG-UNIT tablet Take by mouth 2 (two) times daily.      diltiazem (CARDIZEM CD) 180 MG 24 hr capsule Take 240 mg by mouth daily.     predniSONE (DELTASONE) 5 MG tablet Take 0.5 tablets (2.5 mg  total) by mouth every 12 (twelve) hours. 90 tablet 0   No current facility-administered medications for this visit.    OBJECTIVE: Vitals:   08/20/21 1107  BP: (!) 142/65  Pulse: 61  Temp: (!) 97.5 F (36.4 C)     Body mass index is 26.44 kg/m.    ECOG FS:0 - Asymptomatic  General: Well-developed, well-nourished, no acute distress. Eyes: Pink conjunctiva, anicteric sclera. Lungs: Clear to auscultation bilaterally.  No audible wheezing  or coughing Heart: Regular rate and rhythm.  Abdomen: Soft, nontender, nondistended.  Musculoskeletal: No edema, cyanosis, or clubbing. Neuro: Alert, answering all questions appropriately. Cranial nerves grossly intact. Skin: No rashes or petechiae noted. Psych: Normal affect.    LAB RESULTS:  Lab Results  Component Value Date   NA 136 08/16/2021   K 4.1 08/16/2021   CL 102 08/16/2021   CO2 29 08/16/2021   GLUCOSE 89 08/16/2021   BUN 16 08/16/2021   CREATININE 0.95 08/16/2021   CALCIUM 8.9 08/16/2021   PROT 6.9 08/16/2021   ALBUMIN 3.9 08/16/2021   AST 15 08/16/2021   ALT 13 08/16/2021   ALKPHOS 67 08/16/2021   BILITOT 0.7 08/16/2021   GFRNONAA >60 08/16/2021   GFRAA >60 01/31/2020    Lab Results  Component Value Date   WBC 5.7 08/16/2021   NEUTROABS 4.0 08/16/2021   HGB 14.5 08/16/2021   HCT 43.7 08/16/2021   MCV 93.4 08/16/2021   PLT 248 08/16/2021   Lab Results  Component Value Date   PSA <0.01 10/29/2016   STUDIES: No results found.  ASSESSMENT: Prostate cancer  PLAN:    1. Prostate cancer: Clinically, NED. PSA has remained undetectable at less than 0.01 for since at least 2013. He continues to tolerate zytiga 1000 mg and prednisone 2.5 mg twice a day well (blood sugars/pre-diabetes see below). Given good control of his disease I recommended continuing treatment which he is in agreement. Will continue to check labs every 3 months and virtual or in person visits every 6 months.    2. Elevated bilirubin: Resolved.  3.  Osteopenia: Patient's bone mineral density on November 28, 2020 reported T score of -1.3 which is slightly worse than 2 years prior. Patient declines repeating bone density scan at this time citing lack of payment by insurance if < 2 years. Continue calcium and vitamin D supplementation. Vitamin D level was within normal range of 42.4 in November 2021. Encouraged weight bearing exercise.  Repeat in June 2024.   4. Right hip pain- unlikely related  to prostate cancer given good control as above however, discussed that we could consider imaging. Patient would prefer observation as he feels it is likely MSK related. He can follow up with pcp.   5. Pre-diabetes/Elevated blood sugars- lengthy discussion today regarding impact of diet and steroids on elevated blood sugars. His hga1c with pcp was 6.6. Discussed that many years of steroids could have increased his blood sugars and in combination with diet, inactivity, etc., contributed to elevated blood sugars. However, would not recommend discontinuing prednisone and the low dose he is on now, is likely not having a signifcant impact on his average sugar readings. I reviewed this with Dr. Grayland Ormond as well who agrees that he has good control of cancer on current regimen and would be hesitant to stop treatment/steroids & zytiga. Reviewed risks of chronically elevated blood sugars with patient and recommended he speak with his pcp about possible metformin or other treatments as well as diet recommendations.  Disposition: 3 month lab (cbc, cmp, psa) 6 mo- lab (cbc, cmp, psa), see Dr. Grayland Ormond (ok for virtual appt is patient prefers)  I provided 35 minutes of face-to-face video visit time during this encounter which included chart review, counseling, and coordination of care as documented above.  Patient expressed understanding and was in agreement with this plan. He also understands that He can call clinic at any time with any questions, concerns, or complaints.    Cancer Staging  Cancer of prostate [facility]) Staging form: Prostate, AJCC 7th Edition - Clinical: Stage IV (M1, Gleason <= 6) - Signed by Evlyn Kanner, NP on 04/19/2015 Gleason score: 0   Verlon Au, NP   08/20/2021

## 2021-08-23 ENCOUNTER — Other Ambulatory Visit: Payer: Medicare Other

## 2021-10-10 ENCOUNTER — Other Ambulatory Visit: Payer: Self-pay | Admitting: Oncology

## 2021-10-10 DIAGNOSIS — C61 Malignant neoplasm of prostate: Secondary | ICD-10-CM

## 2022-01-09 ENCOUNTER — Ambulatory Visit (INDEPENDENT_AMBULATORY_CARE_PROVIDER_SITE_OTHER): Payer: Medicare Other | Admitting: Dermatology

## 2022-01-09 DIAGNOSIS — Z1283 Encounter for screening for malignant neoplasm of skin: Secondary | ICD-10-CM | POA: Diagnosis not present

## 2022-01-09 DIAGNOSIS — D1800 Hemangioma unspecified site: Secondary | ICD-10-CM

## 2022-01-09 DIAGNOSIS — L821 Other seborrheic keratosis: Secondary | ICD-10-CM

## 2022-01-09 DIAGNOSIS — D692 Other nonthrombocytopenic purpura: Secondary | ICD-10-CM

## 2022-01-09 DIAGNOSIS — Z86018 Personal history of other benign neoplasm: Secondary | ICD-10-CM

## 2022-01-09 DIAGNOSIS — L578 Other skin changes due to chronic exposure to nonionizing radiation: Secondary | ICD-10-CM

## 2022-01-09 DIAGNOSIS — L814 Other melanin hyperpigmentation: Secondary | ICD-10-CM

## 2022-01-09 DIAGNOSIS — D229 Melanocytic nevi, unspecified: Secondary | ICD-10-CM | POA: Diagnosis not present

## 2022-01-09 DIAGNOSIS — I8393 Asymptomatic varicose veins of bilateral lower extremities: Secondary | ICD-10-CM

## 2022-01-09 DIAGNOSIS — L820 Inflamed seborrheic keratosis: Secondary | ICD-10-CM

## 2022-01-09 NOTE — Patient Instructions (Addendum)
Cryotherapy Aftercare  Wash gently with soap and water everyday.   Apply Vaseline and Band-Aid daily until healed.     Due to recent changes in healthcare laws, you may see results of your pathology and/or laboratory studies on MyChart before the doctors have had a chance to review them. We understand that in some cases there may be results that are confusing or concerning to you. Please understand that not all results are received at the same time and often the doctors may need to interpret multiple results in order to provide you with the best plan of care or course of treatment. Therefore, we ask that you please give us 2 business days to thoroughly review all your results before contacting the office for clarification. Should we see a critical lab result, you will be contacted sooner.   If You Need Anything After Your Visit  If you have any questions or concerns for your doctor, please call our main line at [phone] and press option 4 to reach your doctor's medical assistant. If no one answers, please leave a voicemail as directed and we will return your call as soon as possible. Messages left after 4 pm will be answered the following business day.   You may also send us a message via MyChart. We typically respond to MyChart messages within 1-2 business days.  For prescription refills, please ask your pharmacy to contact our office. Our fax number is [phone].  If you have an urgent issue when the clinic is closed that cannot wait until the next business day, you can page your doctor at the number below.    Please note that while we do our best to be available for urgent issues outside of office hours, we are not available 24/7.   If you have an urgent issue and are unable to reach us, you may choose to seek medical care at your doctor's office, retail clinic, urgent care center, or emergency room.  If you have a medical emergency, please immediately call 911 or go to the  emergency department.  Pager Numbers  - Dr. Kowalski: [phone]  - Dr. Moye: [phone]  - Dr. Stewart: [phone]  In the event of inclement weather, please call our main line at [phone] for an update on the status of any delays or closures.  Dermatology Medication Tips: Please keep the boxes that topical medications come in in order to help keep track of the instructions about where and how to use these. Pharmacies typically print the medication instructions only on the boxes and not directly on the medication tubes.   If your medication is too expensive, please contact our office at [phone] option 4 or send us a message through MyChart.   We are unable to tell what your co-pay for medications will be in advance as this is different depending on your insurance coverage. However, we may be able to find a substitute medication at lower cost or fill out paperwork to get insurance to cover a needed medication.   If a prior authorization is required to get your medication covered by your insurance company, please allow us 1-2 business days to complete this process.  Drug prices often vary depending on where the prescription is filled and some pharmacies may offer cheaper prices.  The website www.goodrx.com contains coupons for medications through different pharmacies. The prices here do not account for what the cost may be with help from insurance (it may be cheaper with your insurance), but the website can   give you the price if you did not use any insurance.  - You can print the associated coupon and take it with your prescription to the pharmacy.  - You may also stop by our office during regular business hours and pick up a GoodRx coupon card.  - If you need your prescription sent electronically to a different pharmacy, notify our office through [place] MyChart or by phone at [phone] option 4.     Si Usted Necesita Algo Despus de Su Visita  Tambin puede  enviarnos un mensaje a travs de MyChart. Por lo general respondemos a los mensajes de MyChart en el transcurso de 1 a 2 das hbiles.  Para renovar recetas, por favor pida a su farmacia que se ponga en contacto con nuestra oficina. Nuestro nmero de fax es el [phone].  Si tiene un asunto urgente cuando la clnica est cerrada y que no puede esperar hasta el siguiente da hbil, puede llamar/localizar a su doctor(a) al nmero que aparece a continuacin.   Por favor, tenga en cuenta que aunque hacemos todo lo posible para estar disponibles para asuntos urgentes fuera del horario de oficina, no estamos disponibles las 24 horas del da, los 7 das de la semana.   Si tiene un problema urgente y no puede comunicarse con nosotros, puede optar por buscar atencin mdica  en el consultorio de su doctor(a), en una clnica privada, en un centro de atencin urgente o en una sala de emergencias.  Si tiene una emergencia mdica, por favor llame inmediatamente al 911 o vaya a la sala de emergencias.  Nmeros de bper  - Dr. Kowalski: [phone]  - Dra. Moye: [phone]  - Dra. Stewart: [phone]  En caso de inclemencias del tiempo, por favor llame a nuestra lnea principal al [phone] para una actualizacin sobre el estado de cualquier retraso o cierre.  Consejos para la medicacin en dermatologa: Por favor, guarde las cajas en las que vienen los medicamentos de uso tpico para ayudarle a seguir las instrucciones sobre dnde y cmo usarlos. Las farmacias generalmente imprimen las instrucciones del medicamento slo en las cajas y no directamente en los tubos del medicamento.   Si su medicamento es muy caro, por favor, pngase en contacto con nuestra oficina llamando al [phone] y presione la opcin 4 o envenos un mensaje a travs de MyChart.   No podemos decirle cul ser su copago por los medicamentos por adelantado ya que esto es diferente dependiendo de la cobertura de su seguro.  Sin embargo, es posible que podamos encontrar un medicamento sustituto a menor costo o llenar un formulario para que el seguro cubra el medicamento que se considera necesario.   Si se requiere una autorizacin previa para que su compaa de seguros cubra su medicamento, por favor permtanos de 1 a 2 das hbiles para completar este proceso.  Los precios de los medicamentos varan con frecuencia dependiendo del lugar de dnde se surte la receta y alguna farmacias pueden ofrecer precios ms baratos.  El sitio web www.goodrx.com tiene cupones para medicamentos de diferentes farmacias. Los precios aqu no tienen en cuenta lo que podra costar con la ayuda del seguro (puede ser ms barato con su seguro), pero el sitio web puede darle el precio si no utiliz ningn seguro.  - Puede imprimir el cupn correspondiente y llevarlo con su receta a la farmacia.  - Tambin puede pasar por nuestra oficina durante el horario de atencin regular y recoger una tarjeta de cupones de GoodRx.  -   Si necesita que su receta se enve electrnicamente a una farmacia diferente, informe a nuestra oficina a travs de MyChart de [place] o por telfono llamando al [phone] y presione la opcin 4.  

## 2022-01-09 NOTE — Progress Notes (Signed)
Tot  Follow-Up Visit   Subjective  Brandon Shelton is a 76 y.o. male who presents for the following: Total body skin exam (Hx of dysplastic nevi) and check spots (R post shoulder and back, itchy prn). The patient presents for Total-Body Skin Exam (TBSE) for skin cancer screening and mole check.  The patient has spots, moles and lesions to be evaluated, some may be new or changing and the patient has concerns that these could be cancer.   The following portions of the chart were reviewed this encounter and updated as appropriate:   Tobacco  Allergies  Meds  Problems  Med Hx  Surg Hx  Fam Hx     Review of Systems:  No other skin or systemic complaints except as noted in HPI or Assessment and Plan.  Objective  Well appearing patient in no apparent distress; mood and affect are within normal limits.  A full examination was performed including scalp, head, eyes, ears, nose, lips, neck, chest, axillae, abdomen, back, buttocks, bilateral upper extremities, bilateral lower extremities, hands, feet, fingers, toes, fingernails, and toenails. All findings within normal limits unless otherwise noted below.  R scapula x 1, R frontal scalp x 1 (2) Stuck on waxy paps with erythema   Assessment & Plan   Lentigines - Scattered tan macules - Due to sun exposure - Benign-appearing, observe - Recommend daily broad spectrum sunscreen SPF 30+ to sun-exposed areas, reapply every 2 hours as needed. - Call for any changes  Seborrheic Keratoses - Stuck-on, waxy, tan-brown papules and/or plaques  - Benign-appearing - Discussed benign etiology and prognosis. - Observe - Call for any changes  Melanocytic Nevi - Tan-brown and/or pink-flesh-colored symmetric macules and papules - Benign appearing on exam today - Observation - Call clinic for new or changing moles - Recommend daily use of broad spectrum spf 30+ sunscreen to sun-exposed areas.   Hemangiomas - Red papules - Discussed benign  nature - Observe - Call for any changes  Actinic Damage - Chronic condition, secondary to cumulative UV/sun exposure - diffuse scaly erythematous macules with underlying dyspigmentation - Recommend daily broad spectrum sunscreen SPF 30+ to sun-exposed areas, reapply every 2 hours as needed.  - Staying in the shade or wearing long sleeves, sun glasses (UVA+UVB protection) and wide brim hats (4-inch brim around the entire circumference of the hat) are also recommended for sun protection.  - Call for new or changing lesions.  Skin cancer screening performed today.  History of Dysplastic Nevi - No evidence of recurrence today - Recommend regular full body skin exams - Recommend daily broad spectrum sunscreen SPF 30+ to sun-exposed areas, reapply every 2 hours as needed.  - Call if any new or changing lesions are noted between office visits  - multiple  Inflamed seborrheic keratosis (2) R scapula x 1, R frontal scalp x 1 Symptomatic, irritating, patient would like treated.  Destruction of lesion - R scapula x 1, R frontal scalp x 1 Complexity: simple   Destruction method: cryotherapy   Informed consent: discussed and consent obtained   Timeout:  patient name, date of birth, surgical site, and procedure verified Lesion destroyed using liquid nitrogen: Yes   Region frozen until ice ball extended beyond lesion: Yes   Outcome: patient tolerated procedure well with no complications   Post-procedure details: wound care instructions given    Purpura - Chronic; persistent and recurrent.  Treatable, but not curable. - Violaceous macules and patches - Benign - Related to trauma, age, sun damage  and/or use of blood thinners, chronic use of topical and/or oral steroids - Observe - Can use OTC arnica containing moisturizer such as Dermend Bruise Formula if desired - Call for worsening or other concerns   Varicose Veins/Spider Veins - Dilated blue, purple or red veins at the lower  extremities - Reassured - Smaller vessels can be treated by sclerotherapy (a procedure to inject a medicine into the veins to make them disappear) if desired, but the treatment is not covered by insurance. Larger vessels may be covered if symptomatic and we would refer to vascular surgeon if treatment desired.   Return in about 1 year (around 01/10/2023) for TBSE, Hx of Dysplastic nevi.  I, Othelia Pulling, RMA, am acting as scribe for Sarina Ser, MD . Documentation: I have reviewed the above documentation for accuracy and completeness, and I agree with the above.  Sarina Ser, MD

## 2022-01-14 ENCOUNTER — Other Ambulatory Visit: Payer: Self-pay | Admitting: Oncology

## 2022-01-14 DIAGNOSIS — C61 Malignant neoplasm of prostate: Secondary | ICD-10-CM

## 2022-01-19 ENCOUNTER — Encounter: Payer: Self-pay | Admitting: Dermatology

## 2022-02-19 NOTE — Progress Notes (Signed)
  [place]  [phone] [phone]) [phone]  ID: JACOBB ALEN OB: 07-25-1945  MR#: [id number]  YOM#:[id number]  Patient Care Team: Juluis Pitch, MD as PCP - General (Family Medicine) [name], MD as Consulting Physician (Hematology and Oncology)     [name], MD   02/23/2022 8:34 AM    This encounter was created in error - please disregard.

## 2022-02-20 ENCOUNTER — Inpatient Hospital Stay: Payer: Medicare Other | Admitting: Oncology

## 2022-02-20 ENCOUNTER — Encounter: Payer: Self-pay | Admitting: Oncology

## 2022-02-20 ENCOUNTER — Encounter: Payer: Self-pay | Admitting: Nurse Practitioner

## 2022-02-20 ENCOUNTER — Inpatient Hospital Stay: Payer: Medicare Other | Attending: Oncology | Admitting: Nurse Practitioner

## 2022-02-20 VITALS — BP 142/75 | HR 60 | Temp 96.2°F | Resp 16 | Wt 147.8 lb

## 2022-02-20 DIAGNOSIS — Z87891 Personal history of nicotine dependence: Secondary | ICD-10-CM | POA: Diagnosis not present

## 2022-02-20 DIAGNOSIS — M8580 Other specified disorders of bone density and structure, unspecified site: Secondary | ICD-10-CM | POA: Diagnosis not present

## 2022-02-20 DIAGNOSIS — Z9079 Acquired absence of other genital organ(s): Secondary | ICD-10-CM | POA: Diagnosis not present

## 2022-02-20 DIAGNOSIS — T380X5A Adverse effect of glucocorticoids and synthetic analogues, initial encounter: Secondary | ICD-10-CM | POA: Diagnosis not present

## 2022-02-20 DIAGNOSIS — Z79899 Other long term (current) drug therapy: Secondary | ICD-10-CM | POA: Diagnosis not present

## 2022-02-20 DIAGNOSIS — C61 Malignant neoplasm of prostate: Secondary | ICD-10-CM | POA: Diagnosis not present

## 2022-02-20 DIAGNOSIS — Z7952 Long term (current) use of systemic steroids: Secondary | ICD-10-CM | POA: Diagnosis not present

## 2022-02-20 NOTE — Progress Notes (Signed)
Brandon Shelton  Telephone:([phone]) [phone] [phone]) [phone]  ID: Brandon Shelton OB: 03-25-1946  MR#: [id number]  VOH#:[id number]  Patient Care Team: Juluis Pitch, MD as PCP - General (Family Medicine) [name], MD as Consulting Physician (Hematology and Oncology)   CHIEF COMPLAINT: Prostate cancer  INTERVAL HISTORY: Patient returns to clinic for repeat evaluation and continuation of zytiga and prednisone. He continues to feel well and remains asymptomatic. He continues to tolerate Zytiga and prednisone without significant side effects. No interval falls or fractures. He has no neurologic complaints. He denies any recent fevers or illnesses. He has a good appetite and denies weight loss.  He denies any chest pain, shortness of breath, cough, or hemoptysis. He denies any nausea, vomiting, constipation, or diarrhea. He has no urinary complaints.  Patient offers no specific complaints today.  REVIEW OF SYSTEMS:   Review of Systems  Constitutional: Negative.  Negative for fever, malaise/fatigue and weight loss.  Respiratory: Negative.  Negative for cough and shortness of breath.   Cardiovascular: Negative.  Negative for chest pain and leg swelling.  Gastrointestinal: Negative.  Negative for abdominal pain, nausea and vomiting.  Genitourinary: Negative.  Negative for frequency.  Musculoskeletal: Negative.  Negative for back pain.  Skin: Negative.  Negative for rash.  Neurological: Negative.  Negative for focal weakness, weakness and headaches.  Endo/Heme/Allergies:  Does not bruise/bleed easily.  Psychiatric/Behavioral: Negative.  The patient is not nervous/anxious.   As per HPI. Otherwise, a complete review of systems is negative.  PAST MEDICAL HISTORY: Past Medical History:  Diagnosis Date   Atypical mole 12/30/2013   L distal lat thigh/mild   Atypical mole 08/06/2017   R super pubic/mild   Atypical mole 10/07/2017   R inf medial scapula/excision    Atypical mole 02/04/2018   R ant deltoid sup/mod   Atypical mole 06/02/2018   R ant deltoid inf/excision   Atypical mole 02/17/2019   R medial pectoral/mild   Bradycardia    Diabetes mellitus without complication (HCC)    High cholesterol    Osteopenia    Palpitations    Paroxysmal supraventricular tachycardia (HCC)    Prostate cancer (HCC)    history of prostate cancer    PAST SURGICAL HISTORY: Prostatectomy. Past Surgical History:  Procedure Laterality Date   COLON SURGERY  1990's   COLONOSCOPY W/ POLYPECTOMY  10/15/1991, 10/20/2001, 08/21/2005, 09/14/2008, 10/04/2013   adenomatous polyps   COLONOSCOPY WITH PROPOFOL N/A 02/15/2019   Procedure: COLONOSCOPY WITH PROPOFOL;  Surgeon: Toledo, Benay Pike, MD;  Location: [facility] ENDOSCOPY;  Service: Gastroenterology;  Laterality: N/A;   LAPAROSCOPIC RETROPUBIC PROSTATECTOMY  02/2004   STAPLE HEMORRHOIDECTOMY       FAMILY HISTORY: Reviewed and unchanged. No reported history of malignancy or chronic disease. History reviewed. No pertinent family history.    ADVANCED DIRECTIVES:    HEALTH MAINTENANCE: Social History   Tobacco Use   Smoking status: Former    Types: Cigarettes    Quit date: 06/24/1972    Years since quitting: 49.6   Smokeless tobacco: Never  Vaping Use   Vaping Use: Never used  Substance Use Topics   Alcohol use: No   Drug use: No     Colonoscopy:  PAP:  Bone density:  Lipid panel:  No Known Allergies  Current Outpatient Medications  Medication Sig Dispense Refill   abiraterone acetate (ZYTIGA) 250 MG tablet TAKE 4 TABLETS DAILY ON AN EMPTY STOMACH 1 HOUR BEFORE OR 2 HOURS AFTER A MEAL 120 tablet 5  atorvastatin (LIPITOR) 20 MG tablet Take 20 mg by mouth daily.     calcium-vitamin D (OSCAL-500) 500-400 MG-UNIT tablet Take by mouth 2 (two) times daily.      Cholecalciferol 25 MCG (1000 UT) capsule Take by mouth.     diltiazem (CARDIZEM CD) 240 MG 24 hr capsule Take 240 mg by mouth daily.     predniSONE  (DELTASONE) 5 MG tablet Take 0.5 tablets (2.5 mg total) by mouth every 12 (twelve) hours. 90 tablet 0   triamcinolone cream (KENALOG) 0.1 % Apply topically.     No current facility-administered medications for this visit.    OBJECTIVE: Vitals:   02/20/22 1154  BP: (!) 142/75  Pulse: 60  Resp: 16  Temp: (!) 96.2 F (35.7 C)  SpO2: 100%     Body mass index is 23.86 kg/m.    ECOG FS:0 - Asymptomatic  General: Well-developed, well-nourished, no acute distress. Eyes: anicteric sclera. Lungs: No audible wheezing or coughing Abdomen: nondistended.  Musculoskeletal: No edema, cyanosis, or clubbing. Neuro: Alert, answering all questions appropriately. Cranial nerves grossly intact. Skin: No rashes or petechiae noted. Psych: Normal affect.   LAB RESULTS:  Component 02/20/2022 09/17/2021 06/06/2021 09/30/2019 06/29/2019 03/11/2019            Glucose 108 115 High    126 High    126 High    120 High    123 High    Load older lab results  Sodium 140 140 141 140 135 Low    141 Load older lab results  Potassium 3.8 4.0 4.8 4.6 4.4 3.9 Load older lab results  Chloride 104 105 107 103 97 102 Load older lab results  Carbon Dioxide (CO2) 30.3 29.1 33.3 High    31.1 33.5 High    32.1 High    Load older lab results  Urea Nitrogen (BUN) _0 Load older lab results  Creatinine 0.8 0.8 0.8 0.8 0.7 0.8 Load older lab results  Glomerular Filtration Rate (eGFR), MDRD Estimate 94 94 94 95 111 95 Load older lab results  Calcium 9.2 9.2 9.4 9.6 9.2 9.5 Load older lab results  AST  _1 Load older lab results  ALT  _2 Load older lab results  Alk Phos (alkaline Phosphatase) 61 69 68 73 77 68 Load older lab results  Albumin 4.4 4.1 4.1 4.2 3.9 4.3 Load older lab results  Bilirubin, Total 1.1 0.8 1.0 1.0 0.9 0.9 Load older lab results  Protein, Total 6.5 6.1 6.3 6.3 5.9 Low    6.3 Load older lab results  A/G Ratio 2.1 2.1 1.9 2.0      [place] 02/20/2022 Component   Component 02/20/2022 09/30/2019 06/29/2019 03/11/2019          WBC (White Blood Cell Count) 4.7 5.9 5.1 4.7 Load older lab results  RBC (Red Blood Cell Count) 4.72 4.60 Low    4.55 Low    4.81 Load older lab results  Hemoglobin 14.3 14.4 14.2 14.7 Load older lab results  Hematocrit 44.2 42.7 42.4 44.1 Load older lab results  MCV (Mean Corpuscular Volume) 93.6 92.8 93.2 91.7 Load older lab results  MCH (Mean Corpuscular Hemoglobin) 30.3 31.3 High    31.2 30.6 Load older lab results  MCHC (Mean Corpuscular Hemoglobin Concentration) 32.4 33.7 33.5 33.3 Load older lab results  RDW-CV (Red Cell Distribution Width) 13.1 12.8 12.4 13.1 Load older  lab results  MPV (Mean Platelet Volume) 10.7 9.8 9.2 Low    9.2 Low       Lab Results  Component Value Date   PSA <0.01 10/29/2016   PSA- [facility]  Component    PSA (Prostate Specific Antigen), Total   Component 02/20/2022 09/30/2019 06/29/2019       PSA (Prostate Specific Antigen), Total <0.01 Low    <0.01 Low    <0.01 Low       STUDIES: No results found.  DG Bone density 11/28/20 T Score -1.3 consistent with osteopenia.   ASSESSMENT: Prostate cancer  PLAN:    1. Prostate cancer: No evidence of disease.  Patient's PSA continues to remain undetectable at less than 0.01. His PSA has been undetectable since at least 2013.  Continue 1000 mg Zytiga and 2.5 mg of prednisone twice per day.  Patient continues to have no interest in discontinuing treatment and is tolerating treatment well. He will have labs with pcp/Dr. Lovie Macadamia in 3 months and again in 6 months. He will follow up with Dr. Grayland Ormond or myself in 6 months. Can also consider telemedicine visit if he prefers.    2. Elevated bilirubin: Resolved. 3.  Osteopenia: Patient's bone mineral density on November 28, 2020 reported T score of -1.3 which is slightly worse than 2 years prior. In 11/27/2018 T score was -0.9. He will continue calcium and  vitamin D. Plan to repeat bone density in June 2024. Monitor in setting of chronic prednisone use.    Dispo 6 mo- APP/[facility] for prostate cancer follow up   Patient expressed understanding and was in agreement with this plan. He also understands that He can call clinic at any time with any questions, concerns, or complaints.    Cancer Staging  Cancer of prostate [facility]) Staging form: Prostate, AJCC 7th Edition - Clinical: Stage IV (M1, Gleason <= 6) - Signed by Evlyn Kanner, NP on 04/19/2015 Gleason score: 0   Verlon Au, NP   02/20/2022

## 2022-02-20 NOTE — Progress Notes (Signed)
Pt in for follow up, reports had PSA, A1C and other labs drawn at [facility] clinic yesterday.

## 2022-02-22 ENCOUNTER — Other Ambulatory Visit: Payer: Self-pay | Admitting: Oncology

## 2022-02-22 DIAGNOSIS — C61 Malignant neoplasm of prostate: Secondary | ICD-10-CM

## 2022-02-22 NOTE — Telephone Encounter (Signed)
CBC w/auto Differential (5 Part) Order: [id number]  Ref Range & Units 2 d ago  WBC (White Blood Cell Count) 4.1 - 10.2 10^3/uL 4.7   RBC (Red Blood Cell Count) 4.69 - 6.13 10^6/uL 4.72   Hemoglobin 14.1 - 18.1 gm/dL 14.3   Hematocrit 40.0 - 52.0 % 44.2   MCV (Mean Corpuscular Volume) 80.0 - 100.0 fl 93.6   MCH (Mean Corpuscular Hemoglobin) 27.0 - 31.2 pg 30.3   MCHC (Mean Corpuscular Hemoglobin Concentration) 32.0 - 36.0 gm/dL 32.4   Platelet Count 150 - 450 10^3/uL 219   RDW-CV (Red Cell Distribution Width) 11.6 - 14.8 % 13.1   MPV (Mean Platelet Volume) 9.4 - 12.4 fl 10.7   Neutrophils 1.50 - 7.80 10^3/uL 2.79   Lymphocytes 1.00 - 3.60 10^3/uL 1.09   Monocytes 0.00 - 1.50 10^3/uL 0.33   Eosinophils 0.00 - 0.55 10^3/uL 0.34   Basophils 0.00 - 0.09 10^3/uL 0.04   Neutrophil % 32.0 - 70.0 % 60.0   Lymphocyte % 10.0 - 50.0 % 23.4   Monocyte % 4.0 - 13.0 % 7.1   Eosinophil % 1.0 - 5.0 % 7.3 High    Basophil% 0.0 - 2.0 % 0.9   Immature Granulocyte % <=0.7 % 1.3 High    Immature Granulocyte Count <=0.06 10^3/L 0.06   Resulting Agency  [facility] - LAB   Specimen Collected: 02/20/22 08:48   Performed by: [facility] Last Resulted: 02/20/22 12:04  Received From: [place]  Result Received: 02/20/22 12:22  PSA, Total (Diagnostic) Order: [id number]  Ref Range & Units 2 d ago  PSA (Prostate Specific Antigen), Total 0.10 - 4.00 ng/mL <0.01 Low    Resulting Agency  [facility] - LAB  Narrative Performed by [facility] - LAB Test results were determined with Beckman Coulter Hybritech Assay. Values obtained with different assay methods cannot be used interchangeably in serial testing. Assay results should not be interpreted as absolute evidence of the presence or absence of malignant disease  Specimen Collected: 02/20/22 08:48   Performed by: [place]: 02/20/22 17:26  Received From: [place]  Result Received: 02/22/22 09:24  Comprehensive Metabolic Panel (CMP) Order: [id number]  Ref Range & Units 2 d ago  Glucose 70 - 110 mg/dL 108   Sodium 136 - 145 mmol/L 140   Potassium 3.6 - 5.1 mmol/L 3.8   Chloride 97 - 109 mmol/L 104   Carbon Dioxide (CO2) 22.0 - 32.0 mmol/L 30.3   Urea Nitrogen (BUN) 7 - 25 mg/dL 19   Creatinine 0.7 - 1.3 mg/dL 0.8   Glomerular Filtration Rate (eGFR), MDRD Estimate >60 mL/min/1.73sq m 94   Calcium 8.7 - 10.3 mg/dL 9.2   AST  8 - 39 U/L 8   ALT  6 - 57 U/L 10   Alk Phos (alkaline Phosphatase) 34 - 104 U/L 61   Albumin 3.5 - 4.8 g/dL 4.4   Bilirubin, Total 0.3 - 1.2 mg/dL 1.1   Protein, Total 6.1 - 7.9 g/dL 6.5   A/G Ratio 1.0 - 5.0 gm/dL 2.1   Resulting Agency  [facility] - LAB   Specimen Collected: 02/20/22 08:48   Performed by: [facility] Last Resulted: 02/20/22 17:34  Received From: [place]  Result Received: 02/22/22 09:24

## 2022-03-25 ENCOUNTER — Telehealth: Payer: Self-pay

## 2022-03-25 NOTE — Telephone Encounter (Signed)
Oral Oncology Patient Advocate Encounter   Was successful in submiting patient on grant renewal for Abiraterone Fabio Asa).  I have spoken with the patient and made them aware of the process.  Waitlist ID: [id number]  Fund name:  PAN[employer].   Berdine Addison, [facility] Patient [place]  [phone] (phone) [phone] (fax)

## 2022-04-02 ENCOUNTER — Other Ambulatory Visit (HOSPITAL_COMMUNITY): Payer: Self-pay

## 2022-04-02 NOTE — Telephone Encounter (Addendum)
Oral Oncology Patient Advocate Encounter   Was successful in securing patient an $7000.00 grant from Patient [place] [facility]) to provide copayment coverage for Abiraterone (Zytiga).  This will keep the out of pocket expense at $0.     I have spoken with the patient.    The billing information is as follows and has been shared with [facility].    Dates of Eligibility: 01/26/22 through 01/26/23  Fund:  [place] Patient Brandon Shelton  [phone] (phone) [phone] (fax) 04/02/2022 12:04 PM

## 2022-04-16 ENCOUNTER — Other Ambulatory Visit (HOSPITAL_COMMUNITY): Payer: Self-pay

## 2022-04-18 ENCOUNTER — Other Ambulatory Visit: Payer: Self-pay | Admitting: Oncology

## 2022-04-18 DIAGNOSIS — C61 Malignant neoplasm of prostate: Secondary | ICD-10-CM

## 2022-04-25 ENCOUNTER — Encounter: Payer: Self-pay | Admitting: Oncology

## 2022-05-13 DIAGNOSIS — I493 Ventricular premature depolarization: Secondary | ICD-10-CM | POA: Insufficient documentation

## 2022-07-13 ENCOUNTER — Other Ambulatory Visit: Payer: Self-pay | Admitting: Oncology

## 2022-07-13 DIAGNOSIS — C61 Malignant neoplasm of prostate: Secondary | ICD-10-CM

## 2022-07-29 ENCOUNTER — Other Ambulatory Visit: Payer: Self-pay | Admitting: Oncology

## 2022-07-29 DIAGNOSIS — C61 Malignant neoplasm of prostate: Secondary | ICD-10-CM

## 2022-08-22 ENCOUNTER — Encounter: Payer: Self-pay | Admitting: Oncology

## 2022-08-22 ENCOUNTER — Inpatient Hospital Stay: Payer: Medicare Other | Attending: Oncology | Admitting: Oncology

## 2022-08-22 DIAGNOSIS — M8589 Other specified disorders of bone density and structure, multiple sites: Secondary | ICD-10-CM

## 2022-08-22 DIAGNOSIS — C61 Malignant neoplasm of prostate: Secondary | ICD-10-CM

## 2022-08-22 DIAGNOSIS — M8580 Other specified disorders of bone density and structure, unspecified site: Secondary | ICD-10-CM

## 2022-08-22 NOTE — Progress Notes (Signed)
[place]  [phone] [phone]) [phone]  ID: Brandon Shelton OB: [birth year]  MR#: [id number]  [id number]  Patient Care Team: Juluis Pitch, MD as PCP - General (Family Medicine) [name], MD as Consulting Physician (Hematology and Oncology)  I connected with [name] on 08/22/22 at  3:30 PM EST by video enabled telemedicine visit and verified that I am speaking with the correct person using two identifiers.   I discussed the limitations, risks, security and privacy concerns of performing an evaluation and management service by telemedicine and the availability of in-person appointments. I also discussed with the patient that there may be a patient responsible charge related to this service. The patient expressed understanding and agreed to proceed.   Other persons participating in the visit and their role in the encounter: Patient, MD.  Patient's location: Home. Provider's location: Clinic.  CHIEF COMPLAINT: Prostate cancer  INTERVAL HISTORY: Patient agreed to video assisted telemedicine visit for routine 22-monthevaluation.  He currently feels well and is asymptomatic.  He continues to tolerate Zytiga and prednisone without significant side effects. He has no neurologic complaints. He denies any recent fevers or illnesses. He has a good appetite and denies weight loss.  He denies any chest pain, shortness of breath, cough, or hemoptysis. He denies any nausea, vomiting, constipation, or diarrhea. He has no urinary complaints.  Patient offers no specific complaints today.    REVIEW OF SYSTEMS:   Review of Systems  Constitutional: Negative.  Negative for fever, malaise/fatigue and weight loss.  Respiratory: Negative.  Negative for cough and shortness of breath.   Cardiovascular: Negative.  Negative for chest pain and leg swelling.  Gastrointestinal: Negative.  Negative for abdominal pain, nausea and vomiting.  Genitourinary:  Negative.  Negative for frequency.  Musculoskeletal: Negative.  Negative for back pain.  Skin: Negative.  Negative for rash.  Neurological: Negative.  Negative for focal weakness, weakness and headaches.  Endo/Heme/Allergies:  Does not bruise/bleed easily.  Psychiatric/Behavioral: Negative.  The patient is not nervous/anxious.     As per HPI. Otherwise, a complete review of systems is negative.  PAST MEDICAL HISTORY: Past Medical History:  Diagnosis Date   Atypical mole 12/30/2013   L distal lat thigh/mild   Atypical mole 08/06/2017   R super pubic/mild   Atypical mole 10/07/2017   R inf medial scapula/excision   Atypical mole 02/04/2018   R ant deltoid sup/mod   Atypical mole 06/02/2018   R ant deltoid inf/excision   Atypical mole 02/17/2019   R medial pectoral/mild   Bradycardia    Diabetes mellitus without complication (HCC)    High cholesterol    Osteopenia    Palpitations    Paroxysmal supraventricular tachycardia    [place]    history of prostate cancer    PAST SURGICAL HISTORY: Prostatectomy.  FAMILY HISTORY: Reviewed and unchanged. No reported history of malignancy or chronic disease.     ADVANCED DIRECTIVES:    HEALTH MAINTENANCE: Social History   Tobacco Use   Smoking status: Former    Types: Cigarettes    Quit date: 06/24/1972    Years since quitting: 50.1   Smokeless tobacco: Never  Vaping Use   Vaping Use: Never used  Substance Use Topics   Alcohol use: No   Drug use: No     Colonoscopy:  PAP:  Bone density:  Lipid panel:  No Known Allergies  Current Outpatient Medications  Medication Sig Dispense Refill   abiraterone acetate (  ZYTIGA) 250 MG tablet TAKE 4 TABLETS DAILY ON AN EMPTY STOMACH 1 HOUR BEFORE OR 2 HOURS AFTER A MEAL 120 tablet 5   atorvastatin (LIPITOR) 20 MG tablet Take 20 mg by mouth daily.     calcium-vitamin D (OSCAL-500) 500-400 MG-UNIT tablet Take by mouth 2 (two) times daily.      Cholecalciferol 25 MCG  (1000 UT) capsule Take by mouth.     diltiazem (CARDIZEM CD) 240 MG 24 hr capsule Take 240 mg by mouth daily.     predniSONE (DELTASONE) 5 MG tablet Take 0.5 tablets (2.5 mg total) by mouth every 12 (twelve) hours. 90 tablet 0   triamcinolone cream (KENALOG) 0.1 % Apply topically.     No current facility-administered medications for this visit.    OBJECTIVE: There were no vitals filed for this visit.   There is no height or weight on file to calculate BMI.    ECOG FS:0 - Asymptomatic   LAB RESULTS:  Lab Results  Component Value Date   NA 136 08/16/2021   K 4.1 08/16/2021   CL 102 08/16/2021   CO2 29 08/16/2021   GLUCOSE 89 08/16/2021   BUN 16 08/16/2021   CREATININE 0.95 08/16/2021   CALCIUM 8.9 08/16/2021   PROT 6.9 08/16/2021   ALBUMIN 3.9 08/16/2021   AST 15 08/16/2021   ALT 13 08/16/2021   ALKPHOS 67 08/16/2021   BILITOT 0.7 08/16/2021   GFRNONAA >60 08/16/2021   GFRAA >60 01/31/2020    Lab Results  Component Value Date   WBC 5.7 08/16/2021   NEUTROABS 4.0 08/16/2021   HGB 14.5 08/16/2021   HCT 43.7 08/16/2021   MCV 93.4 08/16/2021   PLT 248 08/16/2021   Lab Results  Component Value Date   PSA <0.01 10/29/2016     STUDIES: No results found.  ASSESSMENT: Prostate cancer  PLAN:    1. Prostate cancer: Patient continues to have no evidence of disease.  PSA has been undetectable since 2013. Continue 1000 mg Zytiga and 2.5 mg of prednisone twice per day.  Patient continues to have no interest in discontinuing treatment.  Return to clinic in 6 months with repeat laboratory work and video-assisted telemedicine visit. 2. Elevated bilirubin: Resolved. 3.  Osteopenia: Patient's bone mineral density on November 28, 2020 reported T score of -1.3 which is slightly worse than 2 years prior.  Continue calcium and vitamin D supplementation.  Repeat in June 2024.  I provided 20 minutes of face-to-face video visit time during this encounter which included chart review,  counseling, and coordination of care as documented above.     Patient expressed understanding and was in agreement with this plan. He also understands that He can call clinic at any time with any questions, concerns, or complaints.    Cancer Staging  Cancer of prostate [facility]) Staging form: Prostate, AJCC 7th Edition - Clinical: Stage IV (M1, Gleason <= 6) - Signed by Evlyn Kanner, NP on 04/19/2015 Gleason score: 0   [name], MD   08/22/2022 3:16 PM

## 2022-10-08 ENCOUNTER — Ambulatory Visit (INDEPENDENT_AMBULATORY_CARE_PROVIDER_SITE_OTHER): Payer: Medicare Other | Admitting: Dermatology

## 2022-10-08 ENCOUNTER — Encounter: Payer: Self-pay | Admitting: Dermatology

## 2022-10-08 VITALS — BP 124/64 | HR 61

## 2022-10-08 DIAGNOSIS — D692 Other nonthrombocytopenic purpura: Secondary | ICD-10-CM

## 2022-10-08 DIAGNOSIS — L578 Other skin changes due to chronic exposure to nonionizing radiation: Secondary | ICD-10-CM

## 2022-10-08 DIAGNOSIS — L821 Other seborrheic keratosis: Secondary | ICD-10-CM | POA: Diagnosis not present

## 2022-10-08 NOTE — Patient Instructions (Addendum)
Recommend daily broad spectrum sunscreen SPF 30+ to sun-exposed areas, reapply every 2 hours as needed. Call for new or changing lesions.  Staying in the shade or wearing long sleeves, sun glasses (UVA+UVB protection) and wide brim hats (4-inch brim around the entire circumference of the hat) are also recommended for sun protection.    Recommend taking Heliocare sun protection supplement daily in sunny weather for additional sun protection. For maximum protection on the sunniest days, you can take up to 2 capsules of regular Heliocare OR take 1 capsule of Heliocare Ultra. For prolonged exposure (such as a full day in the sun), you can repeat your dose of the supplement 4 hours after your first dose. Heliocare can be purchased at [employer], at some [facility] or at [email or web address].     For bruising: Can use OTC arnica containing moisturizer such as Dermend Bruise Formula if desired    Seborrheic Keratosis  What causes seborrheic keratoses? Seborrheic keratoses are harmless, common skin growths that first appear during adult life.  As time goes by, more growths appear.  Some people may develop a large number of them.  Seborrheic keratoses appear on both covered and uncovered body parts.  They are not caused by sunlight.  The tendency to develop seborrheic keratoses can be inherited.  They vary in color from skin-colored to gray, brown, or even black.  They can be either smooth or have a rough, warty surface.   Seborrheic keratoses are superficial and look as if they were stuck on the skin.  Under the microscope this type of keratosis looks like layers upon layers of skin.  That is why at times the top layer may seem to fall off, but the rest of the growth remains and re-grows.    Treatment Seborrheic keratoses do not need to be treated, but can easily be removed in the office.  Seborrheic keratoses often cause symptoms when they rub on clothing or jewelry.  Lesions can be in the way of  shaving.  If they become inflamed, they can cause itching, soreness, or burning.  Removal of a seborrheic keratosis can be accomplished by freezing, burning, or surgery. If any spot bleeds, scabs, or grows rapidly, please return to have it checked, as these can be an indication of a skin cancer.  Due to recent changes in healthcare laws, you may see results of your pathology and/or laboratory studies on MyChart before the doctors have had a chance to review them. We understand that in some cases there may be results that are confusing or concerning to you. Please understand that not all results are received at the same time and often the doctors may need to interpret multiple results in order to provide you with the best plan of care or course of treatment. Therefore, we ask that you please give [place] 2 business days to thoroughly review all your results before contacting the office for clarification. Should we see a critical lab result, you will be contacted sooner.   If You Need Anything After Your Visit  If you have any questions or concerns for your doctor, please call our main line at [phone] and press option 4 to reach your doctor's medical assistant. If no one answers, please leave a voicemail as directed and we will return your call as soon as possible. Messages left after 4 pm will be answered the following business day.   You may also send [place] a message via MyChart. We typically respond to MyChart messages within  1-2 business days.  For prescription refills, please ask your pharmacy to contact our office. Our fax number is [phone].  If you have an urgent issue when the clinic is closed that cannot wait until the next business day, you can page your doctor at the number below.    Please note that while we do our best to be available for urgent issues outside of office hours, we are not available 24/7.   If you have an urgent issue and are unable to reach [place], you may choose to seek  medical care at your doctor's office, retail clinic, urgent care center, or emergency room.  If you have a medical emergency, please immediately call 911 or go to the emergency department.  Pager Numbers  - Dr. Gwen Pounds: [phone]  - Dr. Neale Burly: [phone]  - Dr. Roseanne Reno: [phone]  In the event of inclement weather, please call our main line at [phone] for an update on the status of any delays or closures.  Dermatology Medication Tips: Please keep the boxes that topical medications come in in order to help keep track of the instructions about where and how to use these. Pharmacies typically print the medication instructions only on the boxes and not directly on the medication tubes.   If your medication is too expensive, please contact our office at [phone] option 4 or send [place] a message through MyChart.   We are unable to tell what your co-pay for medications will be in advance as this is different depending on your insurance coverage. However, we may be able to find a substitute medication at lower cost or fill out paperwork to get insurance to cover a needed medication.   If a prior authorization is required to get your medication covered by your insurance company, please allow [place] 1-2 business days to complete this process.  Drug prices often vary depending on where the prescription is filled and some pharmacies may offer cheaper prices.  The website www.goodrx.com contains coupons for medications through different pharmacies. The prices here do not account for what the cost may be with help from insurance (it may be cheaper with your insurance), but the website can give you the price if you did not use any insurance.  - You can print the associated coupon and take it with your prescription to the pharmacy.  - You may also stop by our office during regular business hours and pick up a GoodRx coupon card.  - If you need your prescription sent electronically to a  different pharmacy, notify our office through [facility] or by phone at [phone] option 4.     Si Usted Necesita Algo Despus de Su Visita  Tambin puede enviarnos un mensaje a travs de Clinical cytogeneticist. Por lo general respondemos a los mensajes de MyChart en el transcurso de 1 a 2 das hbiles.  Para renovar recetas, por favor pida a su farmacia que se ponga en contacto con nuestra oficina. Annie Sable de fax es [place] [phone].  Si tiene un asunto urgente cuando la clnica est cerrada y que no puede esperar hasta el siguiente da hbil, puede llamar/localizar a su doctor(a) al nmero que aparece a continuacin.   Por favor, tenga en cuenta que aunque hacemos todo lo posible para estar disponibles para asuntos urgentes fuera del horario de [place], no estamos disponibles las 24 horas del da, los 7 [address]  [address] de la [place].   Si tiene un problema urgente y no puede comunicarse con nosotros, puede optar por buscar  atencin mdica  en el consultorio de su doctor(a), en una clnica privada, en un centro de atencin urgente o en una sala de emergencias.  Si tiene Engineer, drilling, por favor llame inmediatamente al 911 o vaya a la sala de emergencias.  Nmeros de bper  - Dr. Gwen Pounds: [phone]  - Dra. Moye: [phone]  - Dra. Roseanne Reno: [phone]  En caso de inclemencias del [place], por favor llame a Lacy Duverney principal al [phone] para una actualizacin sobre el [place] de cualquier retraso o cierre.  Consejos para la medicacin en dermatologa: Por favor, guarde las cajas en las que vienen los medicamentos de uso tpico para ayudarle a seguir las instrucciones sobre dnde y cmo usarlos. Las farmacias generalmente imprimen las instrucciones del medicamento slo en las cajas y no directamente en los tubos del [place].   Si su medicamento es muy caro, por favor, pngase en contacto con Rolm Gala llamando al [phone] y presione la opcin 4 o envenos un  mensaje a travs de Clinical cytogeneticist.   No podemos decirle cul ser su copago por los medicamentos por adelantado ya que esto es diferente dependiendo de la cobertura de su seguro. Sin embargo, es posible que podamos encontrar un medicamento sustituto a Audiological scientist un formulario para que el seguro cubra el medicamento que se considera necesario.   Si se requiere una autorizacin previa para que su compaa de seguros [place] su medicamento, por favor permtanos de 1 a 2 das hbiles para completar [address]  Los precios de los medicamentos varan con frecuencia dependiendo del Environmental consultant de dnde se surte la receta y alguna farmacias pueden ofrecer precios ms baratos.  El sitio web www.goodrx.com tiene cupones para medicamentos de Health and safety inspector. Los precios aqu no tienen en cuenta lo que podra costar con la ayuda del seguro (puede ser ms barato con su seguro), pero el sitio web puede darle el precio si no utiliz Tourist information centre manager.  - Puede imprimir el cupn correspondiente y llevarlo con su receta a la farmacia.  - Tambin puede pasar por nuestra oficina durante el horario de atencin regular y Education officer, museum una tarjeta de cupones de GoodRx.  - Si necesita que su receta se enve electrnicamente a una farmacia diferente, informe a nuestra oficina a travs de MyChart de [place] o por telfono llamando al [phone] y presione la opcin 4.

## 2022-10-08 NOTE — Progress Notes (Signed)
   Follow-Up Visit   Subjective  Brandon Shelton is a 77 y.o. male who presents for the following: Spots. Left arm and left leg. Dur: several months on arm, 1-2 months on leg. Dark in color.  He also notes fragile skin and bruising.  The patient has spots, moles and lesions to be evaluated, some may be new or changing and the patient has concerns that these could be cancer.   The following portions of the chart were reviewed this encounter and updated as appropriate: medications, allergies, medical history  Review of Systems:  No other skin or systemic complaints except as noted in HPI or Assessment and Plan.  Objective  Well appearing patient in no apparent distress; mood and affect are within normal limits.  A focused examination was performed of the following areas: Left arm, left leg.  Relevant physical exam findings are noted in the Assessment and Plan.    Assessment & Plan   SEBORRHEIC KERATOSIS - Stuck-on, waxy, tan-brown papules and/or plaques  - Benign-appearing - Discussed benign etiology and prognosis. - Observe - Call for any changes  ACTINIC DAMAGE - chronic, secondary to cumulative UV radiation exposure/sun exposure over time - diffuse scaly erythematous macules with underlying dyspigmentation - Recommend daily broad spectrum sunscreen SPF 30+ to sun-exposed areas, reapply every 2 hours as needed.  - Recommend staying in the shade or wearing long sleeves, sun glasses (UVA+UVB protection) and wide brim hats (4-inch brim around the entire circumference of the hat). - Call for new or changing lesions.  Purpura - Chronic; persistent and recurrent.  Treatable, but not curable. - Violaceous macules and patches at arms - Benign - Related to trauma, age, sun damage and/or use of blood thinners, chronic use of topical and/or oral steroids - Observe - Can use OTC arnica containing moisturizer such as arnica gel or Dermend Bruise Formula if desired - Call for  worsening or other concerns - Recommend Dermend Fragile Skin Formula twice a day. This should be used with sunscreen since it can make you more sensitive to the sun.   Return for TBSE As Scheduled.  I, Lawson Radar, CMA, am acting as scribe for Darden Dates, MD.   Documentation: I have reviewed the above documentation for accuracy and completeness, and I agree with the above.  Darden Dates, MD

## 2022-10-25 ENCOUNTER — Other Ambulatory Visit: Payer: Self-pay | Admitting: Oncology

## 2022-10-25 DIAGNOSIS — C61 Malignant neoplasm of prostate: Secondary | ICD-10-CM

## 2022-12-03 ENCOUNTER — Ambulatory Visit
Admission: RE | Admit: 2022-12-03 | Discharge: 2022-12-03 | Disposition: A | Discharge status: Home / Self Care | Payer: Medicare Other | Source: Ambulatory Visit | Attending: Oncology | Admitting: Oncology

## 2022-12-03 DIAGNOSIS — M8589 Other specified disorders of bone density and structure, multiple sites: Secondary | ICD-10-CM

## 2022-12-03 DIAGNOSIS — M8580 Other specified disorders of bone density and structure, unspecified site: Secondary | ICD-10-CM | POA: Diagnosis present

## 2022-12-30 ENCOUNTER — Telehealth: Payer: Self-pay

## 2022-12-30 ENCOUNTER — Other Ambulatory Visit (HOSPITAL_COMMUNITY): Payer: Self-pay

## 2022-12-30 NOTE — Telephone Encounter (Addendum)
Oral Oncology Patient Advocate Encounter  Was successful in securing patient a $8,000.00 grant from [facility] [employer] to provide copayment coverage for Abiraterone.  This will keep the out of pocket expense at $0.     [employer] ID: [id number]  I have spoken with the patient.    The billing information is as follows and has been shared with [facility].    RxBin: [id number] PCN: [id number] Member ID: [id number] Group ID: [id number] Dates of Eligibility: 11/30/22 through 11/29/23  Fund:  Prostate Cancer - Medicare Access   Ardeen Fillers, CPhT Oncology Pharmacy Patient Advocate  [facility]  [phone] (phone) [phone] (fax) 12/30/2022 11:18 AM

## 2023-01-09 ENCOUNTER — Ambulatory Visit: Payer: Medicare Other | Admitting: Dermatology

## 2023-01-17 ENCOUNTER — Other Ambulatory Visit: Payer: Self-pay | Admitting: Oncology

## 2023-01-17 DIAGNOSIS — C61 Malignant neoplasm of prostate: Secondary | ICD-10-CM

## 2023-01-27 ENCOUNTER — Encounter: Payer: Self-pay | Admitting: Dermatology

## 2023-01-27 ENCOUNTER — Ambulatory Visit (INDEPENDENT_AMBULATORY_CARE_PROVIDER_SITE_OTHER): Payer: Medicare Other | Admitting: Dermatology

## 2023-01-27 VITALS — BP 129/76 | HR 66 | Resp 20 | Ht 66.0 in | Wt 150.0 lb

## 2023-01-27 DIAGNOSIS — L814 Other melanin hyperpigmentation: Secondary | ICD-10-CM

## 2023-01-27 DIAGNOSIS — L820 Inflamed seborrheic keratosis: Secondary | ICD-10-CM | POA: Diagnosis not present

## 2023-01-27 DIAGNOSIS — L729 Follicular cyst of the skin and subcutaneous tissue, unspecified: Secondary | ICD-10-CM

## 2023-01-27 DIAGNOSIS — Z1283 Encounter for screening for malignant neoplasm of skin: Secondary | ICD-10-CM | POA: Diagnosis not present

## 2023-01-27 DIAGNOSIS — L299 Pruritus, unspecified: Secondary | ICD-10-CM

## 2023-01-27 DIAGNOSIS — W908XXA Exposure to other nonionizing radiation, initial encounter: Secondary | ICD-10-CM

## 2023-01-27 DIAGNOSIS — D1801 Hemangioma of skin and subcutaneous tissue: Secondary | ICD-10-CM

## 2023-01-27 DIAGNOSIS — Z5111 Encounter for antineoplastic chemotherapy: Secondary | ICD-10-CM

## 2023-01-27 DIAGNOSIS — L719 Rosacea, unspecified: Secondary | ICD-10-CM | POA: Diagnosis not present

## 2023-01-27 DIAGNOSIS — L2089 Other atopic dermatitis: Secondary | ICD-10-CM

## 2023-01-27 DIAGNOSIS — L209 Atopic dermatitis, unspecified: Secondary | ICD-10-CM | POA: Diagnosis not present

## 2023-01-27 DIAGNOSIS — L821 Other seborrheic keratosis: Secondary | ICD-10-CM

## 2023-01-27 DIAGNOSIS — D229 Melanocytic nevi, unspecified: Secondary | ICD-10-CM

## 2023-01-27 DIAGNOSIS — L720 Epidermal cyst: Secondary | ICD-10-CM

## 2023-01-27 DIAGNOSIS — Z79899 Other long term (current) drug therapy: Secondary | ICD-10-CM

## 2023-01-27 DIAGNOSIS — L578 Other skin changes due to chronic exposure to nonionizing radiation: Secondary | ICD-10-CM

## 2023-01-27 DIAGNOSIS — D692 Other nonthrombocytopenic purpura: Secondary | ICD-10-CM

## 2023-01-27 MED ORDER — EUCRISA 2 % EX OINT
1.0000 | TOPICAL_OINTMENT | Freq: Two times a day (BID) | CUTANEOUS | 11 refills | Status: AC
Start: 2023-01-27 — End: ?

## 2023-01-27 NOTE — Progress Notes (Unsigned)
Follow-Up Visit   Subjective  Brandon Shelton is a 77 y.o. male who presents for the following: TBSE  Patient present today for follow up visit for TBSE. Patient was last evaluated on 12/08/2022. Patient reports no medication changes. Patient reports that through out his lifetime he has had significant sun exposure. Currently, if he has excessive sun exposure he tries to use sunscreen and/or protective coverings.  The following portions of the chart were reviewed this encounter and updated as appropriate: medications, allergies, medical history  Review of Systems:  No other skin or systemic complaints except as noted in HPI or Assessment and Plan.  Objective  Well appearing patient in no apparent distress; mood and affect are within normal limits.  A full examination was performed including scalp, head, eyes, ears, nose, lips, neck, chest, axillae, abdomen, back, buttocks, bilateral upper extremities, bilateral lower extremities, hands, feet, fingers, toes, fingernails, and toenails. All findings within normal limits unless otherwise noted below.   Relevant exam findings are noted in the Assessment and Plan.  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 It is treatable but not curable. It sometimes affects the eyes (ocular rosacea) as well. It may respond to topical and/or systemic medication and can flare with stress, sun exposure, alcohol, exercise, topical steroids (including hydrocortisone/cortisone 10) and some foods.  Daily application of broad spectrum spf 30+ sunscreen to face is recommended to reduce flares.  Patient denies grittiness of the eyes  Treatment Plan - Will Monitor, Call if areas become bothersome,   Milia  Right Nose Exam 1mm white papule Benign, observe.    Rash Atopic Derm with Pruritus   (Remote Hx of Haley haley on the neck diagnosed by Dr Orson Aloe in past)  Exam: mostly clear today. Atopic dermatitis (eczema) is a chronic, relapsing, pruritic condition that can significantly affect quality of life. It is often associated with allergic rhinitis and/or asthma and can require treatment with topical medications, phototherapy, or in severe cases biologic injectable medication (Dupixent; Adbry) or Oral JAK inhibitors.  Chronic and persistent condition with duration or expected duration over one year. Condition is improving with treatment but not currently at goal.  Atopic dermatitis (eczema) is a chronic, relapsing, pruritic condition that can significantly affect quality of life. It is often associated with allergic rhinitis and/or asthma and can require treatment with topical medications, phototherapy, or in severe cases biologic injectable medication (Dupixent; Adbry) or Oral JAK inhibitors. Treatment Plan: - We will plan to try Non-Steroidal topical Pam Drown), to apply every day BID PRN for eczema or itch.  options to prevent worsening skin thinning, if too expensive or noty covered consider tacrolimus, Opzelura or Elidel - D/c TMC - Recommend gentle skin care.  Purpura - Chronic; persistent and recurrent.  Treatable, but not curable. B/L Arms  -Hx of systemic use of Steroids along with medication to treat Prostate Cancer - Violaceous macules and patches - Benign - Related to trauma, age, sun damage and/or use of blood thinners, chronic use of topical and/or oral steroids - Observe - Can use OTC arnica containing moisturizer such as Dermend Bruise Formula if desired - Call for worsening or other concerns   LENTIGINES, SEBORRHEIC KERATOSES, HEMANGIOMAS - Benign normal skin lesions - Benign-appearing - Call for any changes  MELANOCYTIC NEVI - Tan-brown and/or pink-flesh-colored symmetric macules and papules -  Benign appearing on exam  today - Observation - Call clinic for new or changing moles - Recommend daily use of broad spectrum spf 30+ sunscreen to sun-exposed areas.   ACTINIC DAMAGE - Chronic condition, secondary to cumulative UV/sun exposure - diffuse scaly erythematous macules with underlying dyspigmentation - Recommend daily broad spectrum sunscreen SPF 30+ to sun-exposed areas, reapply every 2 hours as needed.  - Staying in the shade or wearing long sleeves, sun glasses (UVA+UVB protection) and wide brim hats (4-inch brim around the entire circumference of the hat) are also recommended for sun protection.  - Call for new or changing lesions.  SKIN CANCER SCREENING PERFORMED TODAY  Inflamed seborrheic keratosis (2) Right Scalpula; Right Postauricular Area  Symptomatic, irritating, patient would like treated.  Benign-appearing.  Call clinic for new or changing lesions.    Destruction of lesion - Right Postauricular Area, Right Scalpula Complexity: simple   Destruction method: cryotherapy   Informed consent: discussed and consent obtained   Timeout:  patient name, date of birth, surgical site, and procedure verified Procedure prep:  Patient was prepped and draped in usual sterile fashion Prep type:  Isopropyl alcohol Anesthesia: the lesion was anesthetized in a standard fashion   Anesthetic:  1% lidocaine w/ epinephrine 1-100,000 buffered w/ 8.4% NaHCO3 Cryotherapy cycles:  3 Hemostasis achieved with:  pressure, aluminum chloride and electrodesiccation Outcome: patient tolerated procedure well with no complications   Post-procedure details: sterile dressing applied and wound care instructions given   Dressing type: bandage and petrolatum    Atopic dermatitis, unspecified type  Related Medications Crisaborole (EUCRISA) 2 % OINT Apply 1 Application topically 2 (two) times daily.  Skin cancer screening  Actinic skin damage  Purpura (HCC)  Lentigo  Melanocytic nevus, unspecified  location  Other atopic dermatitis  Medication management  Chemotherapy management, encounter for  Rosacea  Cyst of skin    Follow up as scheduled.  Documentation: I have reviewed the above documentation for accuracy and completeness, and I agree with the above.  Stasia Cavalier, am acting as scribe for Armida Sans, MD.  Armida Sans, MD Documentation: I have reviewed the above documentation for accuracy and completeness, and I agree with the above.  Armida Sans, MD

## 2023-01-27 NOTE — Patient Instructions (Addendum)

## 2023-02-04 ENCOUNTER — Other Ambulatory Visit: Payer: Self-pay | Admitting: Oncology

## 2023-02-04 DIAGNOSIS — C61 Malignant neoplasm of prostate: Secondary | ICD-10-CM

## 2023-02-10 ENCOUNTER — Ambulatory Visit: Payer: Medicare Other | Admitting: Dermatology

## 2023-02-25 ENCOUNTER — Other Ambulatory Visit: Payer: Medicare Other

## 2023-02-27 ENCOUNTER — Inpatient Hospital Stay: Payer: Medicare Other | Attending: Oncology | Admitting: Oncology

## 2023-02-27 DIAGNOSIS — C61 Malignant neoplasm of prostate: Secondary | ICD-10-CM | POA: Diagnosis not present

## 2023-02-27 NOTE — Progress Notes (Signed)
[facility]  Telephone:(336) [phone] Fax:(336) [phone]  ID: DERREN LAVERNE OB: 1946/04/02  MR#: [id number]  UYQ#:[id number]  Patient Care Team: Dorothey Baseman, MD as PCP - General (Family Medicine) Jeralyn Ruths, MD as Consulting Physician (Hematology and Oncology)  I connected with Jewel Baize on 02/27/23 at  3:30 PM EDT by video enabled telemedicine visit and verified that I am speaking with the correct person using two identifiers.   I discussed the limitations, risks, security and privacy concerns of performing an evaluation and management service by telemedicine and the availability of in-person appointments. I also discussed with the patient that there may be a patient responsible charge related to this service. The patient expressed understanding and agreed to proceed.   Other persons participating in the visit and their role in the encounter: Patient, MD.  Patient's location: Home. Provider's location: Clinic.  CHIEF COMPLAINT: Prostate cancer  INTERVAL HISTORY: Patient agreed to video assisted telemedicine visit for his routine 39-month evaluation.  He continues to feel well and remains asymptomatic.  He continues to tolerate Zytiga and prednisone without significant side effects. He has no neurologic complaints. He denies any recent fevers or illnesses. He has a good appetite and denies weight loss.  He denies any chest pain, shortness of breath, cough, or hemoptysis. He denies any nausea, vomiting, constipation, or diarrhea. He has no urinary complaints.  Patient offers no specific complaints today.  REVIEW OF SYSTEMS:   Review of Systems  Constitutional: Negative.  Negative for fever, malaise/fatigue and weight loss.  Respiratory: Negative.  Negative for cough and shortness of breath.   Cardiovascular: Negative.  Negative for chest pain and leg swelling.  Gastrointestinal: Negative.  Negative for abdominal pain, nausea and vomiting.   Genitourinary: Negative.  Negative for frequency.  Musculoskeletal: Negative.  Negative for back pain.  Skin: Negative.  Negative for rash.  Neurological: Negative.  Negative for focal weakness, weakness and headaches.  Endo/Heme/Allergies:  Does not bruise/bleed easily.  Psychiatric/Behavioral: Negative.  The patient is not nervous/anxious.     As per HPI. Otherwise, a complete review of systems is negative.  PAST MEDICAL HISTORY: Past Medical History:  Diagnosis Date   Atypical mole 12/30/2013   L distal lat thigh/mild   Atypical mole 08/06/2017   R super pubic/mild   Atypical mole 10/07/2017   R inf medial scapula/excision   Atypical mole 02/04/2018   R ant deltoid sup/mod   Atypical mole 06/02/2018   R ant deltoid inf/excision   Atypical mole 02/17/2019   R medial pectoral/mild   Bradycardia    Diabetes mellitus without complication (HCC)    High cholesterol    Osteopenia    Palpitations    Paroxysmal supraventricular tachycardia    Prostate cancer (HCC)    history of prostate cancer    PAST SURGICAL HISTORY: Prostatectomy.  FAMILY HISTORY: Reviewed and unchanged. No reported history of malignancy or chronic disease.     ADVANCED DIRECTIVES:    HEALTH MAINTENANCE: Social History   Tobacco Use   Smoking status: Former    Current packs/day: 0.00    Types: Cigarettes    Quit date: 06/24/1972    Years since quitting: 50.7    Passive exposure: Never   Smokeless tobacco: Never  Vaping Use   Vaping status: Never Used  Substance Use Topics   Alcohol use: No   Drug use: No     Colonoscopy:  PAP:  Bone density:  Lipid panel:  No Known Allergies  Current Outpatient Medications  Medication Sig Dispense Refill   abiraterone acetate (ZYTIGA) 250 MG tablet TAKE 4 TABLETS DAILY ON AN EMPTY STOMACH 1 HOUR BEFORE OR 2 HOURS AFTER A MEAL 120 tablet 5   atorvastatin (LIPITOR) 20 MG tablet Take 20 mg by mouth daily.     calcium-vitamin D (OSCAL-500) 500-400  MG-UNIT tablet Take by mouth 2 (two) times daily.      Cholecalciferol 25 MCG (1000 UT) capsule Take by mouth.     Crisaborole (EUCRISA) 2 % OINT Apply 1 Application topically 2 (two) times daily. 100 g 11   predniSONE (DELTASONE) 5 MG tablet Take 0.5 tablets (2.5 mg total) by mouth every 12 (twelve) hours. 90 tablet 0   triamcinolone cream (KENALOG) 0.1 % Apply topically.     verapamil (CALAN) 40 MG tablet Take by mouth.     verapamil (CALAN-SR) 240 MG CR tablet Take by mouth.     No current facility-administered medications for this visit.    OBJECTIVE: There were no vitals filed for this visit.   There is no height or weight on file to calculate BMI.    ECOG FS:0 - Asymptomatic   LAB RESULTS:  Lab Results  Component Value Date   NA 136 08/16/2021   K 4.1 08/16/2021   CL 102 08/16/2021   CO2 29 08/16/2021   GLUCOSE 89 08/16/2021   BUN 16 08/16/2021   CREATININE 0.95 08/16/2021   CALCIUM 8.9 08/16/2021   PROT 6.9 08/16/2021   ALBUMIN 3.9 08/16/2021   AST 15 08/16/2021   ALT 13 08/16/2021   ALKPHOS 67 08/16/2021   BILITOT 0.7 08/16/2021   GFRNONAA >60 08/16/2021   GFRAA >60 01/31/2020    Lab Results  Component Value Date   WBC 5.7 08/16/2021   NEUTROABS 4.0 08/16/2021   HGB 14.5 08/16/2021   HCT 43.7 08/16/2021   MCV 93.4 08/16/2021   PLT 248 08/16/2021   Lab Results  Component Value Date   PSA <0.01 10/29/2016     STUDIES: No results found.  ASSESSMENT: Prostate cancer  PLAN:    Prostate cancer: Patient continues to have no evidence of disease.  PSA has been undetectable since 2013. Continue 1000 mg Zytiga and 2.5 mg of prednisone twice per day.  We once again discussed the option of discontinuing treatment, but patient has declined.  All of his laboratory work is checked by her primary care.  Return to clinic in 6 months with video-assisted telemedicine visit.   Osteopenia: Patient's most recent bone mineral density on December 03, 2022 reported T-score of  -1.9 which is slightly worse than 2 years prior where her T-score was reported -1.3.  Continue calcium and vitamin D supplementation.  Repeat in June 2025.   I provided 20 minutes of face-to-face video visit time during this encounter which included chart review, counseling, and coordination of care as documented above.     Patient expressed understanding and was in agreement with this plan. He also understands that He can call clinic at any time with any questions, concerns, or complaints.    Cancer Staging  Cancer of prostate [facility]) Staging form: Prostate, AJCC 7th Edition - Clinical: Stage IV (M1, Gleason <= 6) - Signed by Loann Quill, NP on 04/19/2015 Gleason score: 0   Jeralyn Ruths, MD   02/27/2023 4:04 PM

## 2023-04-01 ENCOUNTER — Encounter: Payer: Self-pay | Admitting: Dermatology

## 2023-04-01 ENCOUNTER — Ambulatory Visit (INDEPENDENT_AMBULATORY_CARE_PROVIDER_SITE_OTHER): Payer: Medicare Other | Admitting: Dermatology

## 2023-04-01 DIAGNOSIS — R21 Rash and other nonspecific skin eruption: Secondary | ICD-10-CM | POA: Diagnosis not present

## 2023-04-01 MED ORDER — VALACYCLOVIR HCL 500 MG PO TABS
1000.0000 mg | ORAL_TABLET | Freq: Three times a day (TID) | ORAL | 0 refills | Status: DC
Start: 2023-04-01 — End: 2023-04-07

## 2023-04-01 NOTE — Patient Instructions (Signed)
Start Valacyclovir 500 gm 2 tablets three times daily for 7 days.   Due to recent changes in healthcare laws, you may see results of your pathology and/or laboratory studies on MyChart before the doctors have had a chance to review them. We understand that in some cases there may be results that are confusing or concerning to you. Please understand that not all results are received at the same time and often the doctors may need to interpret multiple results in order to provide you with the best plan of care or course of treatment. Therefore, we ask that you please give [place] 2 business days to thoroughly review all your results before contacting the office for clarification. Should we see a critical lab result, you will be contacted sooner.   If You Need Anything After Your Visit  If you have any questions or concerns for your doctor, please call our main line at [phone] and press option 4 to reach your doctor's medical assistant. If no one answers, please leave a voicemail as directed and we will return your call as soon as possible. Messages left after 4 pm will be answered the following business day.   You may also send [place] a message via MyChart. We typically respond to MyChart messages within 1-2 business days.  For prescription refills, please ask your pharmacy to contact our office. Our fax number is [phone].  If you have an urgent issue when the clinic is closed that cannot wait until the next business day, you can page your doctor at the number below.    Please note that while we do our best to be available for urgent issues outside of office hours, we are not available 24/7.   If you have an urgent issue and are unable to reach [place], you may choose to seek medical care at your doctor's office, retail clinic, urgent care center, or emergency room.  If you have a medical emergency, please immediately call 911 or go to the emergency department.  Pager Numbers  - Dr. Gwen Pounds:  [phone]  - Dr. Roseanne Reno: [phone]  - Dr. Katrinka Blazing: [phone]   In the event of inclement weather, please call our main line at [phone] for an update on the status of any delays or closures.  Dermatology Medication Tips: Please keep the boxes that topical medications come in in order to help keep track of the instructions about where and how to use these. Pharmacies typically print the medication instructions only on the boxes and not directly on the medication tubes.   If your medication is too expensive, please contact our office at [phone] option 4 or send [place] a message through MyChart.   We are unable to tell what your co-pay for medications will be in advance as this is different depending on your insurance coverage. However, we may be able to find a substitute medication at lower cost or fill out paperwork to get insurance to cover a needed medication.   If a prior authorization is required to get your medication covered by your insurance company, please allow [place] 1-2 business days to complete this process.  Drug prices often vary depending on where the prescription is filled and some pharmacies may offer cheaper prices.  The website www.goodrx.com contains coupons for medications through different pharmacies. The prices here do not account for what the cost may be with help from insurance (it may be cheaper with your insurance), but the website can give you the price if you did not  use any insurance.  - You can print the associated coupon and take it with your prescription to the pharmacy.  - You may also stop by our office during regular business hours and pick up a GoodRx coupon card.  - If you need your prescription sent electronically to a different pharmacy, notify our office through [facility] or by phone at [phone] option 4.     Si Usted Necesita Algo Despus de Su Visita  Tambin puede enviarnos un mensaje a travs de Clinical cytogeneticist. Por lo general  respondemos a los mensajes de MyChart en el transcurso de 1 a 2 das hbiles.  Para renovar recetas, por favor pida a su farmacia que se ponga en contacto con nuestra oficina. Annie Sable de fax es [place] [phone].  Si tiene un asunto urgente cuando la clnica est cerrada y que no puede esperar hasta el siguiente da hbil, puede llamar/localizar a su doctor(a) al nmero que aparece a continuacin.   Por favor, tenga en cuenta que aunque hacemos todo lo posible para estar disponibles para asuntos urgentes fuera del horario de [place], no estamos disponibles las 24 horas del da, los 7 [address]  [address] de la [place].   Si tiene un problema urgente y no puede comunicarse con nosotros, puede optar por buscar atencin mdica  en el consultorio de su doctor(a), en una clnica privada, en un centro de atencin urgente o en una sala de emergencias.  Si tiene Engineer, drilling, por favor llame inmediatamente al 911 o vaya a la sala de emergencias.  Nmeros de bper  - Dr. Gwen Pounds: [phone]  - Dra. Roseanne Reno: [phone]  - Dr. Katrinka Blazing: [phone]   En caso de inclemencias del tiempo, por favor llame a Lacy Duverney principal al [phone] para una actualizacin sobre el [place] de cualquier retraso o cierre.  Consejos para la medicacin en dermatologa: Por favor, guarde las cajas en las que vienen los medicamentos de uso tpico para ayudarle a seguir las instrucciones sobre dnde y cmo usarlos. Las farmacias generalmente imprimen las instrucciones del medicamento slo en las cajas y no directamente en los tubos del [place].   Si su medicamento es muy caro, por favor, pngase en contacto con Rolm Gala llamando al [phone] y presione la opcin 4 o envenos un mensaje a travs de Clinical cytogeneticist.   No podemos decirle cul ser su copago por los medicamentos por adelantado ya que esto es diferente dependiendo de la cobertura de su seguro. Sin embargo, es posible que podamos encontrar un  medicamento sustituto a Audiological scientist un formulario para que el seguro cubra el medicamento que se considera necesario.   Si se requiere una autorizacin previa para que su compaa de seguros [place] su medicamento, por favor permtanos de 1 a 2 das hbiles para completar [address]  Los precios de los medicamentos varan con frecuencia dependiendo del Environmental consultant de dnde se surte la receta y alguna farmacias pueden ofrecer precios ms baratos.  El sitio web www.goodrx.com tiene cupones para medicamentos de Health and safety inspector. Los precios aqu no tienen en cuenta lo que podra costar con la ayuda del seguro (puede ser ms barato con su seguro), pero el sitio web puede darle el precio si no utiliz Tourist information centre manager.  - Puede imprimir el cupn correspondiente y llevarlo con su receta a la farmacia.  - Tambin puede pasar por nuestra oficina durante el horario de atencin regular y Education officer, museum una tarjeta de cupones de GoodRx.  - Si necesita que su receta se enve  electrnicamente a [place] diferente, informe a nuestra oficina a travs de MyChart de [place] o por telfono llamando al [phone] y presione la opcin 4.

## 2023-04-01 NOTE — Progress Notes (Signed)
   Follow Up Visit   Subjective  Brandon Shelton is a 77 y.o. male who presents for the following: Rash. States it is right next to rectum. White bumps, looks like blisters. Not itching. Non tender. Noticed them yesterday when getting out of the shower. Applied Tea-Tree oil to area last night. Has prostate cancer, takes hormonal therapy.   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    The following portions of the chart were reviewed this encounter and updated as appropriate: medications, allergies, medical history  Review of Systems:  No other skin or systemic complaints except as noted in HPI or Assessment and Plan.  Objective  Well appearing patient in no apparent distress; mood and affect are within normal limits.  A focused examination was performed of the following areas: buttocks  Relevant exam findings are noted in the Assessment and Plan.    Assessment & Plan   Rash and other nonspecific skin eruption  Related Procedures HSV and VZV PCR Panel Anaerobic and Aerobic Culture  Related Medications valACYclovir (VALTREX) 500 MG tablet Take 2 tablets (1,000 mg total) by mouth 3 (three) times daily for 7 days.    Likely herpes simplex > zoster  Exam:  cluster of pustules on erythematous base a right medial perianal buttock  Treatment Plan: Clinically consistent with herpes simplex. Given patient's concern for zoster, prescribing zoster dosing for valacyclovir and will adjust per PCR result No hx of kidney disease and Cr was normal on 12/02/22 Start Valacyclovir 500 gm 2 tablets three times daily for 7 days. Sending HSV VZV PCR Sending aerobic swab in case viral swabs are negative   Return for TBSE As Scheduled.  I, Lawson Radar, CMA, am acting as scribe for Elie Goody, MD.   Documentation: I have reviewed the above documentation for accuracy and completeness, and I agree with the above.  Elie Goody, MD

## 2023-04-04 LAB — HSV AND VZV PCR PANEL
HSV 2 DNA: POSITIVE — AB
HSV-1 DNA: NEGATIVE
Varicella-Zoster, PCR: NEGATIVE

## 2023-04-07 ENCOUNTER — Telehealth: Payer: Self-pay | Admitting: Dermatology

## 2023-04-07 DIAGNOSIS — R21 Rash and other nonspecific skin eruption: Secondary | ICD-10-CM

## 2023-04-07 DIAGNOSIS — A6000 Herpesviral infection of urogenital system, unspecified: Secondary | ICD-10-CM

## 2023-04-07 MED ORDER — VALACYCLOVIR HCL 500 MG PO TABS
1000.0000 mg | ORAL_TABLET | Freq: Every day | ORAL | 5 refills | Status: AC
Start: 2023-04-07 — End: 2023-04-12

## 2023-04-07 NOTE — Telephone Encounter (Signed)
Spoke to patient regarding PCR result of perianal rash. Shows HSV2 infection. No zoster virus was detected. Patient reports lesions have nearly resolved with valacyclovir. Offered daily suppressive dose vs episodic  dose for recurrences. Cannot predict how often recurrences may happen. Patient reports last outbreak was 30 years ago, so patient opts for episodic treatment; sent this to [facility]. Patient will contact us if flares occur more frequently. Others should avoid physical contact with lesions until healed due to contagiousness. Can ask Dr Gwen Pounds at annual visits for refills. Rx lasts 1 year. All questions answered

## 2023-04-08 LAB — ANAEROBIC AND AEROBIC CULTURE

## 2023-04-10 ENCOUNTER — Other Ambulatory Visit: Payer: Self-pay | Admitting: Oncology

## 2023-04-10 DIAGNOSIS — C61 Malignant neoplasm of prostate: Secondary | ICD-10-CM

## 2023-06-12 ENCOUNTER — Other Ambulatory Visit: Payer: Self-pay | Admitting: Oncology

## 2023-06-12 DIAGNOSIS — C61 Malignant neoplasm of prostate: Secondary | ICD-10-CM

## 2023-08-28 ENCOUNTER — Inpatient Hospital Stay: Payer: Medicare Other | Attending: Oncology | Admitting: Oncology

## 2023-08-28 ENCOUNTER — Encounter: Payer: Self-pay | Admitting: Oncology

## 2023-08-28 VITALS — BP 148/68 | HR 64 | Temp 97.5°F | Resp 16 | Ht 66.0 in | Wt 156.0 lb

## 2023-08-28 DIAGNOSIS — Z87891 Personal history of nicotine dependence: Secondary | ICD-10-CM | POA: Diagnosis not present

## 2023-08-28 DIAGNOSIS — Z79899 Other long term (current) drug therapy: Secondary | ICD-10-CM | POA: Insufficient documentation

## 2023-08-28 DIAGNOSIS — M8580 Other specified disorders of bone density and structure, unspecified site: Secondary | ICD-10-CM | POA: Insufficient documentation

## 2023-08-28 DIAGNOSIS — C61 Malignant neoplasm of prostate: Secondary | ICD-10-CM

## 2023-08-28 DIAGNOSIS — Z8546 Personal history of malignant neoplasm of prostate: Secondary | ICD-10-CM | POA: Insufficient documentation

## 2023-08-28 NOTE — Progress Notes (Signed)
 Brandon Shelton  Telephone:(336) [phone] Fax:(336) [phone]  ID: Brandon Shelton OB: [birth year]  MR#: [id number]  AOZ#:[id number]  Patient Care Team: Brandon Baseman, MD as PCP - General (Family Medicine) Brandon Ruths, MD as Consulting Physician (Hematology and Oncology)  CHIEF COMPLAINT: Prostate cancer  INTERVAL HISTORY: Patient returns to clinic today for routine 17-month evaluation.  He continues to feel well and remains asymptomatic.  He is tolerating Zytiga and prednisone without significant side effects. He has no neurologic complaints. He denies any recent fevers or illnesses. He has a good appetite and denies weight loss.  He denies any chest pain, shortness of breath, cough, or hemoptysis. He denies any nausea, vomiting, constipation, or diarrhea. He has no urinary complaints.  Patient offers no specific complaints today.  REVIEW OF SYSTEMS:   Review of Systems  Constitutional: Negative.  Negative for fever, malaise/fatigue and weight loss.  Respiratory: Negative.  Negative for cough and shortness of breath.   Cardiovascular: Negative.  Negative for chest pain and leg swelling.  Gastrointestinal: Negative.  Negative for abdominal pain, nausea and vomiting.  Genitourinary: Negative.  Negative for frequency.  Musculoskeletal: Negative.  Negative for back pain.  Skin: Negative.  Negative for rash.  Neurological: Negative.  Negative for focal weakness, weakness and headaches.  Endo/Heme/Allergies:  Does not bruise/bleed easily.  Psychiatric/Behavioral: Negative.  The patient is not nervous/anxious.     As per HPI. Otherwise, a complete review of systems is negative.  PAST MEDICAL HISTORY: Past Medical History:  Diagnosis Date   Atypical mole 12/30/2013   L distal lat thigh/mild   Atypical mole 08/06/2017   R super pubic/mild   Atypical mole 10/07/2017   R inf medial scapula/excision   Atypical mole 02/04/2018   R ant deltoid sup/mod   Atypical  mole 06/02/2018   R ant deltoid inf/excision   Atypical mole 02/17/2019   R medial pectoral/mild   Bradycardia    Diabetes mellitus without complication (HCC)    High cholesterol    Osteopenia    Palpitations    Paroxysmal supraventricular tachycardia (HCC)    Prostate cancer (HCC)    history of prostate cancer    PAST SURGICAL HISTORY: Prostatectomy.  FAMILY HISTORY: Reviewed and unchanged. No reported history of malignancy or chronic disease.     ADVANCED DIRECTIVES:    HEALTH MAINTENANCE: Social History   Tobacco Use   Smoking status: Former    Current packs/day: 0.00    Types: Cigarettes    Quit date: 06/24/1972    Years since quitting: 51.2    Passive exposure: Never   Smokeless tobacco: Never  Vaping Use   Vaping status: Never Used  Substance Use Topics   Alcohol use: No   Drug use: No     Colonoscopy:  PAP:  Bone density:  Lipid panel:  No Known Allergies  Current Outpatient Medications  Medication Sig Dispense Refill   abiraterone acetate (ZYTIGA) 250 MG tablet TAKE 4 TABLETS DAILY ON AN EMPTY STOMACH 1 HOUR BEFORE OR 2 HOURS AFTER A MEAL 120 tablet 5   atorvastatin (LIPITOR) 20 MG tablet Take 20 mg by mouth daily.     calcium-vitamin D (OSCAL-500) 500-400 MG-UNIT tablet Take by mouth 2 (two) times daily.      Cholecalciferol 25 MCG (1000 UT) capsule Take by mouth.     Crisaborole (EUCRISA) 2 % OINT Apply 1 Application topically 2 (two) times daily. 100 g 11   cyanocobalamin (VITAMIN B12) 500 MCG tablet Take 500  mcg by mouth in the morning and at bedtime.     predniSONE (DELTASONE) 5 MG tablet Take 0.5 tablets (2.5 mg total) by mouth every 12 (twelve) hours. 90 tablet 0   triamcinolone cream (KENALOG) 0.1 % Apply topically.     verapamil (CALAN) 40 MG tablet Take by mouth.     verapamil (CALAN-SR) 240 MG CR tablet Take by mouth.     No current facility-administered medications for this visit.    OBJECTIVE: Vitals:   08/28/23 1518  BP: (!) 148/68   Pulse: 64  Resp: 16  Temp: (!) 97.5 F (36.4 C)  SpO2: 100%     Body mass index is 25.18 kg/m.    ECOG FS:0 - Asymptomatic  General: Well-developed, well-nourished, no acute distress. Eyes: Pink conjunctiva, anicteric sclera. HEENT: Normocephalic, moist mucous membranes. Lungs: No audible wheezing or coughing. Heart: Regular rate and rhythm. Abdomen: Soft, nontender, no obvious distention. Musculoskeletal: No edema, cyanosis, or clubbing. Neuro: Alert, answering all questions appropriately. Cranial nerves grossly intact. Skin: No rashes or petechiae noted. Psych: Normal affect.  LAB RESULTS:  Lab Results  Component Value Date   NA 136 08/16/2021   K 4.1 08/16/2021   CL 102 08/16/2021   CO2 29 08/16/2021   GLUCOSE 89 08/16/2021   BUN 16 08/16/2021   CREATININE 0.95 08/16/2021   CALCIUM 8.9 08/16/2021   PROT 6.9 08/16/2021   ALBUMIN 3.9 08/16/2021   AST 15 08/16/2021   ALT 13 08/16/2021   ALKPHOS 67 08/16/2021   BILITOT 0.7 08/16/2021   GFRNONAA >60 08/16/2021   GFRAA >60 01/31/2020    Lab Results  Component Value Date   WBC 5.7 08/16/2021   NEUTROABS 4.0 08/16/2021   HGB 14.5 08/16/2021   HCT 43.7 08/16/2021   MCV 93.4 08/16/2021   PLT 248 08/16/2021   Lab Results  Component Value Date   PSA <0.01 10/29/2016     STUDIES: No results found.  ASSESSMENT: Prostate cancer  PLAN:    Prostate cancer: Patient continues to have no evidence of disease.  PSA has been undetectable since 2013.  His most recent PSA is less than 0.1.  Continue 1000 mg Zytiga and 2.5 mg of prednisone twice per day.  We once again discussed the option of discontinuing treatment, but patient has declined.  Laboratory work continues to be monitored routinely by primary care.  No intervention is needed.  Return to clinic in 6 months for further evaluation.   Osteopenia: Patient's most recent bone mineral density on December 03, 2022 reported T-score of -1.9 which is slightly worse than 2  years prior where his T-score was reported -1.3.  Continue calcium and vitamin D supplementation.  Repeat in June 2025.   I provided 20 minutes of face-to-face video visit time during this encounter which included chart review, counseling, and coordination of care as documented above.   Patient expressed understanding and was in agreement with this plan. He also understands that He can call clinic at any time with any questions, concerns, or complaints.    Cancer Staging  Cancer of prostate [facility] Staging form: Prostate, AJCC 7th Edition - Clinical: Stage IV (M1, Gleason <= 6) - Signed by Loann Quill, NP on 04/19/2015 Gleason score: 0   Brandon Ruths, MD   08/28/2023 4:40 PM

## 2023-09-09 ENCOUNTER — Other Ambulatory Visit: Payer: Self-pay | Admitting: Oncology

## 2023-09-09 DIAGNOSIS — C61 Malignant neoplasm of prostate: Secondary | ICD-10-CM

## 2023-09-18 ENCOUNTER — Encounter: Payer: Self-pay | Admitting: Internal Medicine

## 2023-09-26 ENCOUNTER — Other Ambulatory Visit: Payer: Self-pay | Admitting: Oncology

## 2023-09-26 DIAGNOSIS — C61 Malignant neoplasm of prostate: Secondary | ICD-10-CM

## 2023-09-29 ENCOUNTER — Encounter: Payer: Self-pay | Admitting: Internal Medicine

## 2023-09-30 ENCOUNTER — Ambulatory Visit: Admitting: Anesthesiology

## 2023-09-30 ENCOUNTER — Encounter: Payer: Self-pay | Admitting: Internal Medicine

## 2023-09-30 ENCOUNTER — Ambulatory Visit
Admission: RE | Admit: 2023-09-30 | Discharge: 2023-09-30 | Disposition: A | Discharge status: Home / Self Care | Payer: Medicare Other | Source: Ambulatory Visit | Attending: Internal Medicine | Admitting: Internal Medicine

## 2023-09-30 ENCOUNTER — Encounter
Admission: RE | Disposition: A | Discharge status: Home / Self Care | Payer: Self-pay | Source: Ambulatory Visit | Attending: Internal Medicine

## 2023-09-30 DIAGNOSIS — Z87891 Personal history of nicotine dependence: Secondary | ICD-10-CM | POA: Insufficient documentation

## 2023-09-30 DIAGNOSIS — Z1211 Encounter for screening for malignant neoplasm of colon: Secondary | ICD-10-CM | POA: Insufficient documentation

## 2023-09-30 DIAGNOSIS — E119 Type 2 diabetes mellitus without complications: Secondary | ICD-10-CM | POA: Diagnosis not present

## 2023-09-30 DIAGNOSIS — D125 Benign neoplasm of sigmoid colon: Secondary | ICD-10-CM | POA: Insufficient documentation

## 2023-09-30 DIAGNOSIS — K640 First degree hemorrhoids: Secondary | ICD-10-CM | POA: Diagnosis not present

## 2023-09-30 DIAGNOSIS — K5730 Diverticulosis of large intestine without perforation or abscess without bleeding: Secondary | ICD-10-CM | POA: Diagnosis not present

## 2023-09-30 DIAGNOSIS — I10 Essential (primary) hypertension: Secondary | ICD-10-CM | POA: Insufficient documentation

## 2023-09-30 HISTORY — DX: Ventricular premature depolarization: I49.3

## 2023-09-30 HISTORY — DX: Nonrheumatic mitral (valve) insufficiency: I34.0

## 2023-09-30 HISTORY — PX: COLONOSCOPY WITH PROPOFOL: SHX5780

## 2023-09-30 HISTORY — DX: Hyperlipidemia, unspecified: E78.5

## 2023-09-30 HISTORY — PX: POLYPECTOMY: SHX149

## 2023-09-30 LAB — GLUCOSE, CAPILLARY: Glucose-Capillary: 87 mg/dL (ref 70–99)

## 2023-09-30 SURGERY — COLONOSCOPY WITH PROPOFOL
Anesthesia: General

## 2023-09-30 MED ORDER — SODIUM CHLORIDE 0.9 % IV SOLN: INTRAVENOUS | Status: DC

## 2023-09-30 MED ORDER — LIDOCAINE HCL (CARDIAC) PF 100 MG/5ML IV SOSY
PREFILLED_SYRINGE | INTRAVENOUS | Status: DC | PRN
  Administered 2023-09-30: 60 mg via INTRAVENOUS

## 2023-09-30 MED ORDER — DEXMEDETOMIDINE HCL IN NACL 80 MCG/20ML IV SOLN
INTRAVENOUS | Status: DC | PRN
  Administered 2023-09-30: 20 ug via INTRAVENOUS

## 2023-09-30 MED ORDER — PROPOFOL 500 MG/50ML IV EMUL
INTRAVENOUS | Status: DC | PRN
  Administered 2023-09-30: 75 ug/kg/min via INTRAVENOUS

## 2023-09-30 MED ORDER — PROPOFOL 10 MG/ML IV BOLUS
INTRAVENOUS | Status: DC | PRN
  Administered 2023-09-30: 30 mg via INTRAVENOUS

## 2023-09-30 NOTE — Transfer of Care (Signed)
 Immediate Anesthesia Transfer of Care Note  Patient: Brandon Shelton  Procedure(s) Performed: COLONOSCOPY WITH PROPOFOL POLYPECTOMY, INTESTINE  Patient Location: PACU  Anesthesia Type:General  Level of Consciousness: sedated  Airway & Oxygen Therapy: Patient Spontanous Breathing  Post-op Assessment: Report given to RN and Post -op Vital signs reviewed and stable  Post vital signs: Reviewed and stable  Last Vitals:  Vitals Value Taken Time  BP    Temp    Pulse 58 09/30/23 1403  Resp 10 09/30/23 1403  SpO2 98 % 09/30/23 1403  Vitals shown include unfiled device data.  Last Pain:  Vitals:   09/30/23 1255  TempSrc: Temporal         Complications: No notable events documented.

## 2023-09-30 NOTE — H&P (Signed)
 Outpatient short stay form Pre-procedure 09/30/2023 1:38 PM Brandon Shelton K. Brandon Shelton, M.D.  Primary Physician: Dorothey Baseman, M.D.    Reason for visit:  Hx of adenomatous colon polyps  History of present illness:                            Patient presents for colonoscopy for a personal hx of colon polyps. The patient denies abdominal pain, abnormal weight loss or rectal bleeding.      Current Facility-Administered Medications:    0.9 %  sodium chloride infusion, , Intravenous, Continuous, [place], Boykin Nearing, MD, Last Rate: 20 mL/hr at 09/30/23 1315, Continued from Pre-op at 09/30/23 1315  Medications Prior to Admission  Medication Sig Dispense Refill Last Dose/Taking   atorvastatin (LIPITOR) 20 MG tablet Take 20 mg by mouth daily.   09/29/2023   calcium-vitamin D (OSCAL-500) 500-400 MG-UNIT tablet Take by mouth 2 (two) times daily.    Past Week   Cholecalciferol 25 MCG (1000 UT) capsule Take by mouth.   Past Week   cyanocobalamin (VITAMIN B12) 500 MCG tablet Take 500 mcg by mouth in the morning and at bedtime.   Past Week   predniSONE (DELTASONE) 5 MG tablet Take 0.5 tablets (2.5 mg total) by mouth every 12 (twelve) hours. 90 tablet 0 09/29/2023   verapamil (CALAN-SR) 240 MG CR tablet Take by mouth.   09/29/2023   abiraterone acetate (ZYTIGA) 250 MG tablet TAKE 4 TABLETS DAILY ON AN EMPTY STOMACH 1 HOUR BEFORE OR 2 HOURS AFTER A MEAL 120 tablet 5 09/28/2023   Crisaborole (EUCRISA) 2 % OINT Apply 1 Application topically 2 (two) times daily. 100 g 11    triamcinolone cream (KENALOG) 0.1 % Apply topically.      verapamil (CALAN) 40 MG tablet Take by mouth.        No Known Allergies   Past Medical History:  Diagnosis Date   Atypical mole 12/30/2013   L distal lat thigh/mild   Atypical mole 08/06/2017   R super pubic/mild   Atypical mole 10/07/2017   R inf medial scapula/excision   Atypical mole 02/04/2018   R ant deltoid sup/mod   Atypical mole 06/02/2018   R ant deltoid inf/excision    Atypical mole 02/17/2019   R medial pectoral/mild   Bradycardia    Diabetes mellitus without complication (HCC)    Frequent PVCs    High cholesterol    Hyperlipidemia    Mild mitral regurgitation    Osteopenia    Osteopenia    Palpitations    Paroxysmal supraventricular tachycardia (HCC)    Paroxysmal supraventricular tachycardia (HCC)    Prostate cancer (HCC)    history of prostate cancer    Review of systems:  Otherwise negative.    Physical Exam  Gen: Alert, oriented. Appears stated age.  HEENT: /AT. PERRLA. Lungs: CTA, no wheezes. CV: RR nl S1, S2. Abd: soft, benign, no masses. BS+ Ext: No edema. Pulses 2+    Planned procedures: Proceed with colonoscopy. The patient understands the nature of the planned procedure, indications, risks, alternatives and potential complications including but not limited to bleeding, infection, perforation, damage to internal organs and possible oversedation/side effects from anesthesia. The patient agrees and gives consent to proceed.  Please refer to procedure notes for findings, recommendations and patient disposition/instructions.     Brandon Shelton K. Brandon Shelton, M.D. Gastroenterology 09/30/2023  1:38 PM

## 2023-09-30 NOTE — Op Note (Signed)
 [facility] Gastroenterology Patient Name: Brandon Shelton Procedure Date: 09/30/2023 1:32 PM MRN: [id number] Account #: [id number] Date of Birth: [birth year] Admit Type: Outpatient Age: 78 Room: [facility] ENDO ROOM 1 Gender: Male Note Status: Finalized Instrument Name: Prentice Docker [id number] Procedure:             Colonoscopy Indications:           High risk colon cancer surveillance: Personal history                         of multiple (3 or more) adenomas Providers:             Boykin Nearing. [name] MD, MD Referring MD:          Teena Irani. Terance Hart, MD (Referring MD) Medicines:             Propofol per Anesthesia Complications:         No immediate complications. Estimated blood loss: None. Procedure:             Pre-Anesthesia Assessment:                        - The risks and benefits of the procedure and the                         sedation options and risks were discussed with the                         patient. All questions were answered and informed                         consent was obtained.                        - Patient identification and proposed procedure were                         verified prior to the procedure by the nurse. The                         procedure was verified in the procedure room.                        - ASA Grade Assessment: III - A patient with severe                         systemic disease.                        - After reviewing the risks and benefits, the patient                         was deemed in satisfactory condition to undergo the                         procedure.                        After obtaining informed consent, the colonoscope was  passed under direct vision. Throughout the procedure,                         the patient's blood pressure, pulse, and oxygen                         saturations were monitored continuously. The                         Colonoscope was introduced through the anus  and                         advanced to the the cecum, identified by appendiceal                         orifice and ileocecal valve. The colonoscopy was                         performed without difficulty. The patient tolerated                         the procedure well. The quality of the bowel                         preparation was adequate. The ileocecal valve,                         appendiceal orifice, and rectum were photographed. Findings:      The perianal and digital rectal examinations were normal. Pertinent       negatives include normal sphincter tone and no palpable rectal lesions.      Non-bleeding internal hemorrhoids were found during retroflexion. The       hemorrhoids were Grade I (internal hemorrhoids that do not prolapse).      Many medium-mouthed diverticula were found in the entire colon.      An 18 mm polyp was found in the sigmoid colon. The polyp was       semi-pedunculated. The polyp was removed with a hot snare. Resection and       retrieval were complete. Estimated blood loss: none.      The exam was otherwise without abnormality. Impression:            - Non-bleeding internal hemorrhoids.                        - Diverticulosis in the entire examined colon.                        - One 18 mm polyp in the sigmoid colon, removed with a                         hot snare. Resected and retrieved.                        - The examination was otherwise normal. Recommendation:        - Patient has a contact number available for                         emergencies. The signs and  symptoms of potential                         delayed complications were discussed with the patient.                         Return to normal activities tomorrow. Written                         discharge instructions were provided to the patient.                        - Resume previous diet.                        - Continue present medications.                        - If polyps are  benign or adenomatous without                         dysplasia, I will advise NO further colonoscopy due to                         advanced age and/or severe comorbidity.                        - Return to GI office PRN.                        - The findings and recommendations were discussed with                         the patient. Procedure Code(s):     --- Professional ---                        [phone], Colonoscopy, flexible; with removal of                         tumor(s), polyp(s), or other lesion(s) by snare                         technique Diagnosis Code(s):     --- Professional ---                        K57.30, Diverticulosis of large intestine without                         perforation or abscess without bleeding                        D12.5, Benign neoplasm of sigmoid colon                        K64.0, First degree hemorrhoids                        Z86.010, Personal history of colonic polyps CPT copyright 2022 American Medical Association. All rights reserved. The codes documented in this report are preliminary and upon coder review may  be revised  to meet current compliance requirements. Stanton Kidney MD, MD 09/30/2023 2:03:04 PM This report has been signed electronically. Number of Addenda: 0 Note Initiated On: 09/30/2023 1:32 PM Scope Withdrawal Time: 0 hours 7 minutes 0 seconds  Total Procedure Duration: 0 hours 13 minutes 1 second  Estimated Blood Loss:  Estimated blood loss: none.      [facility]

## 2023-09-30 NOTE — Anesthesia Preprocedure Evaluation (Signed)
 Anesthesia Evaluation  Patient identified by MRN, date of birth, ID band Patient awake    Reviewed: Allergy & Precautions, NPO status , Patient's Chart, lab work & pertinent test results  History of Anesthesia Complications Negative for: history of anesthetic complications  Airway Mallampati: III  TM Distance: <3 FB Neck ROM: full    Dental  (+) Chipped   Pulmonary neg shortness of breath, former smoker   Pulmonary exam normal        Cardiovascular Exercise Tolerance: Good hypertension, (-) angina Normal cardiovascular exam     Neuro/Psych negative neurological ROS  negative psych ROS   GI/Hepatic negative GI ROS, Neg liver ROS,,,  Endo/Other  diabetes, Type 2    Renal/GU negative Renal ROS  negative genitourinary   Musculoskeletal   Abdominal   Peds  Hematology negative hematology ROS (+)   Anesthesia Other Findings Past Medical History: 12/30/2013: Atypical mole     Comment:  L distal lat thigh/mild 08/06/2017: Atypical mole     Comment:  R super pubic/mild 10/07/2017: Atypical mole     Comment:  R inf medial scapula/excision 02/04/2018: Atypical mole     Comment:  R ant deltoid sup/mod 06/02/2018: Atypical mole     Comment:  R ant deltoid inf/excision 02/17/2019: Atypical mole     Comment:  R medial pectoral/mild No date: Bradycardia No date: Diabetes mellitus without complication (HCC) No date: Frequent PVCs No date: High cholesterol No date: Hyperlipidemia No date: Mild mitral regurgitation No date: Osteopenia No date: Osteopenia No date: Palpitations No date: Paroxysmal supraventricular tachycardia (HCC) No date: Paroxysmal supraventricular tachycardia (HCC) No date: Prostate cancer (HCC)     Comment:  history of prostate cancer  Past Surgical History: 1990's: COLON SURGERY 10/15/1991, 10/20/2001, 08/21/2005, 09/14/2008, 10/04/2013: COLONOSCOPY W/  POLYPECTOMY     Comment:  adenomatous  polyps 02/15/2019: COLONOSCOPY WITH PROPOFOL; N/A     Comment:  Procedure: COLONOSCOPY WITH PROPOFOL;  Surgeon: [place],               Boykin Nearing, MD;  Location: [facility] ENDOSCOPY;  Service:               Gastroenterology;  Laterality: N/A; 02/2004: LAPAROSCOPIC RETROPUBIC PROSTATECTOMY No date: STAPLE HEMORRHOIDECTOMY  BMI    Body Mass Index: 24.11 kg/m      Reproductive/Obstetrics negative OB ROS                             Anesthesia Physical Anesthesia Plan  ASA: 2  Anesthesia Plan: General   Post-op Pain Management:    Induction: Intravenous  PONV Risk Score and Plan: Propofol infusion and TIVA  Airway Management Planned: Natural Airway and Nasal Cannula  Additional Equipment:   Intra-op Plan:   Post-operative Plan:   Informed Consent: I have reviewed the patients History and Physical, chart, labs and discussed the procedure including the risks, benefits and alternatives for the proposed anesthesia with the patient or authorized representative who has indicated his/her understanding and acceptance.     Dental Advisory Given  Plan Discussed with: Anesthesiologist, CRNA and Surgeon  Anesthesia Plan Comments: (Patient consented for risks of anesthesia including but not limited to:  - adverse reactions to medications - risk of airway placement if required - damage to eyes, teeth, lips or other oral mucosa - nerve damage due to positioning  - sore throat or hoarseness - Damage to heart, brain, nerves, lungs, other parts of body or loss  of life  Patient voiced understanding and assent.)       Anesthesia Quick Evaluation

## 2023-09-30 NOTE — Interval H&P Note (Signed)
 History and Physical Interval Note:  09/30/2023 1:41 PM  Brandon Shelton  has presented today for surgery, with the diagnosis of Z86.0101 (ICD-10-CM) - Hx of adenomatous colonic polyps.  The various methods of treatment have been discussed with the patient and family. After consideration of risks, benefits and other options for treatment, the patient has consented to  Procedure(s): COLONOSCOPY WITH PROPOFOL (N/A) as a surgical intervention.  The patient's history has been reviewed, patient examined, no change in status, stable for surgery.  I have reviewed the patient's chart and labs.  Questions were answered to the patient's satisfaction.     [place], [place]

## 2023-10-01 ENCOUNTER — Encounter: Payer: Self-pay | Admitting: Internal Medicine

## 2023-10-01 LAB — SURGICAL PATHOLOGY

## 2023-10-01 NOTE — Anesthesia Postprocedure Evaluation (Signed)
 Anesthesia Post Note  Patient: Brandon Shelton  Procedure(s) Performed: COLONOSCOPY WITH PROPOFOL POLYPECTOMY, INTESTINE  Patient location during evaluation: Endoscopy Anesthesia Type: General Level of consciousness: awake and alert Pain management: pain level controlled Vital Signs Assessment: post-procedure vital signs reviewed and stable Respiratory status: spontaneous breathing, nonlabored ventilation, respiratory function stable and patient connected to nasal cannula oxygen Cardiovascular status: blood pressure returned to baseline and stable Postop Assessment: no apparent nausea or vomiting Anesthetic complications: no   No notable events documented.   Last Vitals:  Vitals:   09/30/23 1404 09/30/23 1414  BP: (!) 112/59 109/65  Pulse:    Resp:    Temp: (!) 35.9 C   SpO2:      Last Pain:  Vitals:   10/01/23 0741  TempSrc:   PainSc: 0-No pain                 Cleda Mccreedy Adrianah Prophete

## 2023-12-15 ENCOUNTER — Encounter: Payer: Self-pay | Admitting: Dermatology

## 2023-12-15 ENCOUNTER — Ambulatory Visit: Admitting: Dermatology

## 2023-12-15 DIAGNOSIS — D3611 Benign neoplasm of peripheral nerves and autonomic nervous system of face, head, and neck: Secondary | ICD-10-CM | POA: Diagnosis not present

## 2023-12-15 DIAGNOSIS — D492 Neoplasm of unspecified behavior of bone, soft tissue, and skin: Secondary | ICD-10-CM | POA: Diagnosis not present

## 2023-12-15 NOTE — Progress Notes (Signed)
   Follow-Up Visit   Subjective  Brandon Shelton is a 78 y.o. male who presents for the following: place at L scalp behind L ear present x a while, felt like it started rising. Not itchy not sore to the touch.   The patient has spots, moles and lesions to be evaluated, some may be new or changing and the patient may have concern these could be cancer.   The following portions of the chart were reviewed this encounter and updated as appropriate: medications, allergies, medical history  Review of Systems:  No other skin or systemic complaints except as noted in HPI or Assessment and Plan.  Objective  Well appearing patient in no apparent distress; mood and affect are within normal limits.  A focused examination was performed of the following areas: Scalp  Relevant exam findings are noted in the Assessment and Plan.  Left postauricular scalp 5 mm pink papule with few telangiectasias   Assessment & Plan   NEOPLASM OF SKIN Left postauricular scalp Skin / nail biopsy Type of biopsy: tangential   Informed consent: discussed and consent obtained   Timeout: patient name, date of birth, surgical site, and procedure verified   Procedure prep:  Patient was prepped and draped in usual sterile fashion Prep type:  Isopropyl alcohol Anesthesia: the lesion was anesthetized in a standard fashion   Anesthetic:  1% lidocaine  w/ epinephrine 1-100,000 buffered w/ 8.4% NaHCO3 Instrument used: DermaBlade   Hemostasis achieved with: pressure and aluminum chloride   Outcome: patient tolerated procedure well   Post-procedure details: sterile dressing applied and wound care instructions given   Dressing type: bandage and petrolatum   Specimen 1 - Surgical pathology Differential Diagnosis: nevus vs neurofibroma vs other  Check Margins: No 5 mm pink papule with few telangiectasias  Return for As scheduled, w/ Dr. Hester, TBSE.  I, Jacquelynn V. Wilfred, CMA, am acting as scribe for Boneta Sharps, MD    Documentation: I have reviewed the above documentation for accuracy and completeness, and I agree with the above.  Boneta Sharps, MD

## 2023-12-15 NOTE — Patient Instructions (Addendum)

## 2023-12-17 LAB — SURGICAL PATHOLOGY

## 2023-12-22 ENCOUNTER — Ambulatory Visit: Payer: Self-pay | Admitting: Dermatology

## 2024-01-01 ENCOUNTER — Ambulatory Visit: Payer: Medicare Other | Admitting: Dermatology

## 2024-01-13 ENCOUNTER — Ambulatory Visit: Admitting: Dermatology

## 2024-01-13 DIAGNOSIS — L570 Actinic keratosis: Secondary | ICD-10-CM

## 2024-01-13 DIAGNOSIS — I781 Nevus, non-neoplastic: Secondary | ICD-10-CM

## 2024-01-13 DIAGNOSIS — L820 Inflamed seborrheic keratosis: Secondary | ICD-10-CM

## 2024-01-13 DIAGNOSIS — D692 Other nonthrombocytopenic purpura: Secondary | ICD-10-CM

## 2024-01-13 DIAGNOSIS — W908XXA Exposure to other nonionizing radiation, initial encounter: Secondary | ICD-10-CM | POA: Diagnosis not present

## 2024-01-13 DIAGNOSIS — Z1283 Encounter for screening for malignant neoplasm of skin: Secondary | ICD-10-CM

## 2024-01-13 DIAGNOSIS — L720 Epidermal cyst: Secondary | ICD-10-CM

## 2024-01-13 DIAGNOSIS — L814 Other melanin hyperpigmentation: Secondary | ICD-10-CM

## 2024-01-13 DIAGNOSIS — L578 Other skin changes due to chronic exposure to nonionizing radiation: Secondary | ICD-10-CM | POA: Diagnosis not present

## 2024-01-13 DIAGNOSIS — Z86018 Personal history of other benign neoplasm: Secondary | ICD-10-CM

## 2024-01-13 DIAGNOSIS — L729 Follicular cyst of the skin and subcutaneous tissue, unspecified: Secondary | ICD-10-CM

## 2024-01-13 DIAGNOSIS — D229 Melanocytic nevi, unspecified: Secondary | ICD-10-CM

## 2024-01-13 DIAGNOSIS — Z7189 Other specified counseling: Secondary | ICD-10-CM

## 2024-01-13 DIAGNOSIS — D1801 Hemangioma of skin and subcutaneous tissue: Secondary | ICD-10-CM

## 2024-01-13 NOTE — Progress Notes (Unsigned)
 Follow-Up Visit   Subjective  Brandon Shelton is a 78 y.o. male who presents for the following: Skin Cancer Screening and Full Body Skin Exam Hx of isks, hx of dysplastic nevi  The patient presents for Total-Body Skin Exam (TBSE) for skin cancer screening and mole check. The patient has spots, moles and lesions to be evaluated, some may be new or changing and the patient may have concern these could be cancer.  The following portions of the chart were reviewed this encounter and updated as appropriate: medications, allergies, medical history  Review of Systems:  No other skin or systemic complaints except as noted in HPI or Assessment and Plan.  Objective  Well appearing patient in no apparent distress; mood and affect are within normal limits.  A full examination was performed including scalp, head, eyes, ears, nose, lips, neck, chest, axillae, abdomen, back, buttocks, bilateral upper extremities, bilateral lower extremities, hands, feet, fingers, toes, fingernails, and toenails. All findings within normal limits unless otherwise noted below.   Relevant physical exam findings are noted in the Assessment and Plan.  left superior helix  x 2 (2) Erythematous thin papules/macules with gritty scale.  left forehead x 2, right scapula x 1, right low back x 1, right calf x 1, left umbilical x 1, right arm x 1, left calf x 1, left pretibia x 1 (9) Erythematous stuck-on, waxy papule or plaque  Assessment & Plan   SKIN CANCER SCREENING PERFORMED TODAY.  ACTINIC DAMAGE - Chronic condition, secondary to cumulative UV/sun exposure - diffuse scaly erythematous macules with underlying dyspigmentation - Recommend daily broad spectrum sunscreen SPF 30+ to sun-exposed areas, reapply every 2 hours as needed.  - Staying in the shade or wearing long sleeves, sun glasses (UVA+UVB protection) and wide brim hats (4-inch brim around the entire circumference of the hat) are also recommended for sun  protection.  - Call for new or changing lesions.  TELANGIECTASIA Exam: pink patch dilated blood vessel(s) at left chest  Treatment Plan: Benign appearing on exam Call for changes Counseling for BBL / IPL / Laser and Coordination of Care Discussed the treatment option of Broad Band Light (BBL) /Intense Pulsed Light (IPL)/ Laser for skin discoloration, including brown spots and redness.  Typically we recommend at least 1-3 treatment sessions about 5-8 weeks apart for best results.  Cannot have tanned skin when BBL performed, and regular use of sunscreen/photoprotection is advised after the procedure to help maintain results. The patient's condition may also require maintenance treatments in the future.  The fee for BBL / laser treatments is $350 per treatment session for the whole face or $200 for one spot..  A fee can be quoted for other parts of the body.  Insurance typically does not pay for BBL/laser treatments and therefore the fee is an out-of-pocket cost. Recommend prophylactic valtrex  treatment. Once scheduled for procedure, will send Rx in prior to patient's appointment.   LENTIGINES, SEBORRHEIC KERATOSES, HEMANGIOMAS - Benign normal skin lesions - Benign-appearing - Call for any changes  MELANOCYTIC NEVI - Tan-brown and/or pink-flesh-colored symmetric macules and papules - Benign appearing on exam today - Observation - Call clinic for new or changing moles - Recommend daily use of broad spectrum spf 30+ sunscreen to sun-exposed areas.   [place]  Right Nose Exam 1mm white papule Benign, observe.    Purpura - Chronic; persistent and recurrent.  Treatable, but not curable. 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   ACTINIC KERATOSIS (2) left superior helix  x 2 (2) Actinic keratoses are precancerous spots that appear secondary to cumulative UV radiation exposure/sun exposure over time. They are chronic with expected duration over 1 year. A portion of actinic keratoses will progress to squamous cell carcinoma of the skin. It is not possible to reliably predict which spots will progress to skin cancer and so treatment is recommended to prevent development of skin cancer.  Recommend daily broad spectrum sunscreen SPF 30+ to sun-exposed areas, reapply every 2 hours as needed.  Recommend staying in the shade or wearing long sleeves, sun glasses (UVA+UVB protection) and wide brim hats (4-inch brim around the entire circumference of the hat). Call for new or changing lesions. Destruction of lesion - left superior helix  x 2 (2) Complexity: simple   Destruction method: cryotherapy   Informed consent: discussed and consent obtained   Timeout:  patient name, date of birth, surgical site, and procedure verified Lesion destroyed using liquid nitrogen: Yes   Region frozen until ice ball extended beyond lesion: Yes   Outcome: patient tolerated procedure well with no complications   Post-procedure details: wound care  instructions given    INFLAMED SEBORRHEIC KERATOSIS (9) left forehead x 2, right scapula x 1, right low back x 1, right calf x 1, left umbilical x 1, right arm x 1, left calf x 1, left pretibia x 1 (9) Symptomatic, irritating, patient would like treated. Destruction of lesion - left forehead x 2, right scapula x 1, right low back x 1, right calf x 1, left umbilical x 1, right arm x 1, left calf x 1, left pretibia x 1 (9) Complexity: simple   Destruction method: cryotherapy   Informed consent: discussed and consent obtained   Timeout:  patient name, date of birth, surgical site, and procedure verified Lesion destroyed using liquid nitrogen: Yes   Region frozen until ice ball extended beyond lesion: Yes   Outcome: patient tolerated procedure well with no complications   Post-procedure details: wound care instructions given    Return in about 1 year (around 01/12/2025) for TBSE.  IEleanor Blush, CMA, am acting as scribe for Alm Rhyme, MD.   Documentation: I have reviewed the above documentation for accuracy and completeness, and I agree with the above.  Alm Rhyme, MD

## 2024-01-13 NOTE — Patient Instructions (Addendum)

## 2024-01-14 ENCOUNTER — Other Ambulatory Visit: Payer: Self-pay | Admitting: Oncology

## 2024-01-14 ENCOUNTER — Encounter: Payer: Self-pay | Admitting: Dermatology

## 2024-01-14 DIAGNOSIS — C61 Malignant neoplasm of prostate: Secondary | ICD-10-CM

## 2024-03-01 ENCOUNTER — Inpatient Hospital Stay: Admitting: Oncology

## 2024-03-04 ENCOUNTER — Inpatient Hospital Stay: Attending: Oncology | Admitting: Oncology

## 2024-03-04 ENCOUNTER — Encounter: Payer: Self-pay | Admitting: Oncology

## 2024-03-04 DIAGNOSIS — C61 Malignant neoplasm of prostate: Secondary | ICD-10-CM

## 2024-03-04 NOTE — Progress Notes (Signed)
 Patient is doing great, no new questions for the doctor today.

## 2024-03-04 NOTE — Progress Notes (Signed)
 [facility]  [phone] [phone]  ID: Brandon Shelton OB: 1945/09/04  MR#: [id number]  RDW#:[id number]  Patient Care Team: Glover Lenis, MD as PCP - General (Family Medicine) Jacobo Evalene PARAS, MD as Consulting Physician (Hematology and Oncology)  I connected with Brandon Shelton on 03/04/24 at  2:45 PM EDT by video enabled telemedicine visit and verified that I am speaking with the correct person using two identifiers.   I discussed the limitations, risks, security and privacy concerns of performing an evaluation and management service by telemedicine and the availability of in-person appointments. I also discussed with the patient that there may be a patient responsible charge related to this service. The patient expressed understanding and agreed to proceed.   Other persons participating in the visit and their role in the encounter: Patient, MD.  Patient's location: Home. Provider's location: Clinic.  CHIEF COMPLAINT: Prostate cancer  INTERVAL HISTORY: Patient agreed to video-assisted telemedicine visit for further evaluation, discussion of his laboratory results, and continuation of Zytiga  and prednisone .  He continues to feel well and remains asymptomatic.  He is tolerating treatment without significant side effects.  [place]  He has no neurologic complaints. He denies any recent fevers or illnesses. He has a good appetite and denies weight loss.  He denies any chest pain, shortness of breath, cough, or hemoptysis. He denies any nausea, vomiting, constipation, or diarrhea. He has no urinary complaints.  Patient offers no specific complaints today.  REVIEW OF SYSTEMS:   Review of Systems  Constitutional: Negative.  Negative for fever, malaise/fatigue and weight loss.  Respiratory: Negative.  Negative for cough and shortness of breath.   Cardiovascular:  Negative.  Negative for chest pain and leg swelling.  Gastrointestinal: Negative.  Negative for abdominal pain, nausea and vomiting.  Genitourinary: Negative.  Negative for frequency.  Musculoskeletal: Negative.  Negative for back pain.  Skin: Negative.  Negative for rash.  Neurological: Negative.  Negative for focal weakness, weakness and headaches.  Endo/Heme/Allergies:  Does not bruise/bleed easily.  Psychiatric/Behavioral: Negative.  The patient is not nervous/anxious.     As per HPI. Otherwise, a complete review of systems is negative.  PAST MEDICAL HISTORY: Past Medical History:  Diagnosis Date   Atypical mole 12/30/2013   L distal lat thigh/mild   Atypical mole 08/06/2017   R super pubic/mild   Atypical mole 10/07/2017   R inf medial scapula/excision   Atypical mole 02/04/2018   R ant deltoid sup/mod   Atypical mole 06/02/2018   R ant deltoid inf/excision   Atypical mole 02/17/2019   R medial pectoral/mild   Bradycardia    Diabetes mellitus without complication (HCC)    Frequent PVCs    High cholesterol    Hyperlipidemia    Mild mitral regurgitation    Osteopenia    Osteopenia    Palpitations    Paroxysmal supraventricular tachycardia (HCC)    Paroxysmal supraventricular tachycardia (HCC)    Prostate cancer (HCC)    history of prostate cancer    PAST SURGICAL HISTORY: Prostatectomy.  FAMILY HISTORY: Reviewed and unchanged. No reported history of malignancy or chronic disease.     ADVANCED DIRECTIVES:    HEALTH MAINTENANCE: Social History   Tobacco Use   Smoking status: Former    Current packs/day: 0.00    Types: Cigarettes    Quit date: 06/24/1972    Years since quitting: 51.7    Passive exposure:  Never   Smokeless tobacco: Never  Vaping Use   Vaping status: Never Used  Substance Use Topics   Alcohol use: No   Drug use: No     Colonoscopy:  PAP:  Bone density:  Lipid panel:  No Known Allergies  Current Outpatient Medications  Medication  Sig Dispense Refill   abiraterone  acetate (ZYTIGA ) 250 MG tablet TAKE 4 TABLETS DAILY ON AN EMPTY STOMACH 1 HOUR BEFORE OR 2 HOURS AFTER A MEAL 120 tablet 5   atorvastatin  (LIPITOR) 20 MG tablet Take 20 mg by mouth daily.     calcium -vitamin D  (OSCAL-500) 500-400 MG-UNIT tablet Take by mouth 2 (two) times daily.      Cholecalciferol 25 MCG (1000 UT) capsule Take by mouth.     Crisaborole  (EUCRISA ) 2 % OINT Apply 1 Application topically 2 (two) times daily. 100 g 11   cyanocobalamin (VITAMIN B12) 500 MCG tablet Take 500 mcg by mouth in the morning and at bedtime.     predniSONE  (DELTASONE ) 5 MG tablet Take 0.5 tablets (2.5 mg total) by mouth every 12 (twelve) hours. 90 tablet 0   triamcinolone cream (KENALOG) 0.1 % Apply topically.     verapamil (CALAN) 40 MG tablet Take by mouth.     verapamil (CALAN-SR) 240 MG CR tablet Take by mouth.     No current facility-administered medications for this visit.    OBJECTIVE: There were no vitals filed for this visit.    There is no height or weight on file to calculate BMI.    ECOG FS:0 - Asymptomatic    LAB RESULTS:  Lab Results  Component Value Date   NA 136 08/16/2021   K 4.1 08/16/2021   CL 102 08/16/2021   CO2 29 08/16/2021   GLUCOSE 89 08/16/2021   BUN 16 08/16/2021   CREATININE 0.95 08/16/2021   CALCIUM  8.9 08/16/2021   PROT 6.9 08/16/2021   ALBUMIN 3.9 08/16/2021   AST 15 08/16/2021   ALT 13 08/16/2021   ALKPHOS 67 08/16/2021   BILITOT 0.7 08/16/2021   GFRNONAA >60 08/16/2021   GFRAA >60 01/31/2020    Lab Results  Component Value Date   WBC 5.7 08/16/2021   NEUTROABS 4.0 08/16/2021   HGB 14.5 08/16/2021   HCT 43.7 08/16/2021   MCV 93.4 08/16/2021   PLT 248 08/16/2021   Lab Results  Component Value Date   PSA <0.01 10/29/2016     STUDIES: No results found.  ASSESSMENT: Prostate cancer  PLAN:    Prostate cancer: Patient continues to have no evidence of disease.  PSA has been undetectable since 2013.  His  most recent PSA on December 15, 2023 was reported less than 0.01.  Continue 1000 mg Zytiga  and 2.5 mg of prednisone  twice per day.  We once again discussed the option of discontinuing treatment, but patient has declined.  Laboratory work continues to be monitored routinely by primary care.  No intervention is needed at this time.  Return to clinic in 6 months with video-assisted telemedicine visit.   Osteopenia: Patient's most recent bone mineral density on December 03, 2022 reported T-score of -1.9 which is slightly worse than 2 years prior where his T-score was reported -1.3.  Continue calcium  and vitamin D  supplementation.  Repeat in June 2026.  I provided 20 minutes of face-to-face video visit time during this encounter which included chart review, counseling, and coordination of care as documented above.    Patient expressed understanding and was in agreement with this plan.  He also understands that He can call clinic at any time with any questions, concerns, or complaints.    Cancer Staging  Cancer of prostate [facility]) Staging form: Prostate, AJCC 7th Edition - Clinical: Stage IV (M1, Gleason <= 6) - Signed by Lionell Sonny FALCON, NP on 04/19/2015 Gleason score: 0   Evalene JINNY Reusing, MD   03/04/2024 2:37 PM

## 2024-03-09 ENCOUNTER — Other Ambulatory Visit: Payer: Self-pay | Admitting: Oncology

## 2024-03-09 DIAGNOSIS — C61 Malignant neoplasm of prostate: Secondary | ICD-10-CM

## 2024-03-10 ENCOUNTER — Other Ambulatory Visit: Payer: Self-pay

## 2024-03-10 MED ORDER — FLUZONE HIGH-DOSE 0.5 ML IM SUSY
0.5000 mL | PREFILLED_SYRINGE | Freq: Once | INTRAMUSCULAR | 0 refills | Status: AC
Start: 2024-03-10 — End: 2024-03-11
  Filled 2024-03-10: qty 0.5, 1d supply, fill #0

## 2024-04-12 ENCOUNTER — Other Ambulatory Visit: Payer: Self-pay | Admitting: Oncology

## 2024-04-12 DIAGNOSIS — C61 Malignant neoplasm of prostate: Secondary | ICD-10-CM

## 2024-04-21 ENCOUNTER — Other Ambulatory Visit: Payer: Self-pay

## 2024-04-21 MED ORDER — COMIRNATY 30 MCG/0.3ML IM SUSY
0.3000 mL | PREFILLED_SYRINGE | Freq: Once | INTRAMUSCULAR | 0 refills | Status: AC
  Filled 2024-04-21: qty 0.3, 1d supply, fill #0

## 2024-07-16 ENCOUNTER — Other Ambulatory Visit: Payer: Self-pay | Admitting: Oncology

## 2024-07-16 ENCOUNTER — Other Ambulatory Visit: Payer: Self-pay | Admitting: Internal Medicine

## 2024-07-16 DIAGNOSIS — C61 Malignant neoplasm of prostate: Secondary | ICD-10-CM

## 2024-07-16 DIAGNOSIS — I471 Supraventricular tachycardia, unspecified: Secondary | ICD-10-CM

## 2024-07-22 ENCOUNTER — Ambulatory Visit
Admission: RE | Admit: 2024-07-22 | Discharge: 2024-07-22 | Disposition: A | Discharge status: Home / Self Care | Payer: Self-pay | Source: Ambulatory Visit | Attending: Internal Medicine | Admitting: Internal Medicine

## 2024-07-22 DIAGNOSIS — I471 Supraventricular tachycardia, unspecified: Secondary | ICD-10-CM | POA: Insufficient documentation

## 2025-01-12 ENCOUNTER — Ambulatory Visit: Admitting: Dermatology
